# Patient Record
Sex: Female | Born: 1975 | Race: White | Hispanic: No | Marital: Married | Smoking: Never smoker
Health system: Southern US, Community
[De-identification: ages and names within clinical notes are randomized; demographics above are authoritative.]

## PROBLEM LIST (undated history)

## (undated) DIAGNOSIS — E119 Type 2 diabetes mellitus without complications: Secondary | ICD-10-CM

## (undated) DIAGNOSIS — F419 Anxiety disorder, unspecified: Secondary | ICD-10-CM

## (undated) DIAGNOSIS — I10 Essential (primary) hypertension: Secondary | ICD-10-CM

## (undated) DIAGNOSIS — G629 Polyneuropathy, unspecified: Secondary | ICD-10-CM

## (undated) HISTORY — PX: ABDOMINAL HYSTERECTOMY: SHX81

## (undated) HISTORY — PX: TOTAL ABDOMINAL HYSTERECTOMY: SHX209

---

## 2022-02-25 ENCOUNTER — Other Ambulatory Visit: Payer: Self-pay

## 2022-02-25 ENCOUNTER — Emergency Department (HOSPITAL_BASED_OUTPATIENT_CLINIC_OR_DEPARTMENT_OTHER)
Admission: EM | Admit: 2022-02-25 | Discharge: 2022-02-25 | Disposition: A | Discharge status: Home / Self Care | Payer: BLUE CROSS/BLUE SHIELD | Attending: Emergency Medicine | Admitting: Emergency Medicine

## 2022-02-25 ENCOUNTER — Encounter (HOSPITAL_BASED_OUTPATIENT_CLINIC_OR_DEPARTMENT_OTHER): Payer: Self-pay | Admitting: Emergency Medicine

## 2022-02-25 DIAGNOSIS — R197 Diarrhea, unspecified: Secondary | ICD-10-CM | POA: Diagnosis not present

## 2022-02-25 DIAGNOSIS — J029 Acute pharyngitis, unspecified: Secondary | ICD-10-CM | POA: Diagnosis not present

## 2022-02-25 DIAGNOSIS — R059 Cough, unspecified: Secondary | ICD-10-CM | POA: Insufficient documentation

## 2022-02-25 DIAGNOSIS — R110 Nausea: Secondary | ICD-10-CM | POA: Diagnosis not present

## 2022-02-25 DIAGNOSIS — E119 Type 2 diabetes mellitus without complications: Secondary | ICD-10-CM | POA: Diagnosis not present

## 2022-02-25 DIAGNOSIS — H66003 Acute suppurative otitis media without spontaneous rupture of ear drum, bilateral: Secondary | ICD-10-CM | POA: Insufficient documentation

## 2022-02-25 DIAGNOSIS — I10 Essential (primary) hypertension: Secondary | ICD-10-CM | POA: Diagnosis not present

## 2022-02-25 DIAGNOSIS — Z20822 Contact with and (suspected) exposure to covid-19: Secondary | ICD-10-CM | POA: Insufficient documentation

## 2022-02-25 DIAGNOSIS — H9192 Unspecified hearing loss, left ear: Secondary | ICD-10-CM | POA: Diagnosis present

## 2022-02-25 HISTORY — DX: Type 2 diabetes mellitus without complications: E11.9

## 2022-02-25 HISTORY — DX: Anxiety disorder, unspecified: F41.9

## 2022-02-25 HISTORY — DX: Polyneuropathy, unspecified: G62.9

## 2022-02-25 HISTORY — DX: Essential (primary) hypertension: I10

## 2022-02-25 LAB — GROUP A STREP BY PCR: Group A Strep by PCR: NOT DETECTED

## 2022-02-25 LAB — RESP PANEL BY RT-PCR (FLU A&B, COVID) ARPGX2
Influenza A by PCR: NEGATIVE
Influenza B by PCR: NEGATIVE
SARS Coronavirus 2 by RT PCR: NEGATIVE

## 2022-02-25 MED ORDER — AZITHROMYCIN 250 MG PO TABS: ORAL_TABLET | ORAL | 0 refills | Status: DC

## 2022-02-25 MED ORDER — AZITHROMYCIN 250 MG PO TABS
500.0000 mg | ORAL_TABLET | Freq: Once | ORAL | Status: AC
  Administered 2022-02-25: 500 mg via ORAL
  Filled 2022-02-25: qty 2

## 2022-02-25 NOTE — ED Triage Notes (Signed)
Bilateral otalgia , cough , sore throat . Nausea . Diarrhea .  ?

## 2022-02-25 NOTE — ED Provider Notes (Signed)
? ?[facility] ?Provider Note: Lowella Dell, MD, FACEP ? ?CSN: [id number] ?MRN: [id number] ?ARRIVAL: 02/25/22 at 2034 ?ROOM: [facility] ? ? ?CHIEF COMPLAINT  ?Ear Pain ? ? ?HISTORY OF PRESENT ILLNESS  ?02/25/22 10:56 PM ?Nicole Alvarado is a 46 y.o. female with 4 days of bilateral ear pain which she rates as a 10 out of 10.  She has decreased hearing in the left ear.  For the past 2 days she has had a cough and a sore throat.  She has not had a fever.  She has had nausea and diarrhea.  She was prescribed antibiotic eardrops after a telemedicine consult but these have not helped. ? ? ?Past Medical History:  ?Diagnosis Date  ? Anxiety   ? Diabetes mellitus without complication (HCC)   ? Hypertension   ? Neuropathy   ? ? ?History reviewed. No pertinent surgical history. ? ?History reviewed. No pertinent family history. ? ?  ? ?Prior to Admission medications   ?Not on File  ? ? ?Allergies ?Penicillins, Bactrim [sulfamethoxazole-trimethoprim], and Sulfa antibiotics ? ? ?REVIEW OF SYSTEMS  ?Negative except as noted here or in the History of Present Illness. ? ? ?PHYSICAL EXAMINATION  ?Initial Vital Signs ?Blood pressure (!) 159/104, pulse 98, temperature 98.2 ?F (36.8 ?C), temperature source Oral, resp. rate 18, weight (!) 140.2 kg, SpO2 98 %. ? ?Examination ?General: Well-developed, well-nourished female in no acute distress; appearance consistent with age of record ?HENT: normocephalic; atraumatic; right TM erythematous; left TM erythematous and bulging with effusion; pharynx normal ?Eyes: pupils equal, round and reactive to light; extraocular muscles intact ?Neck: supple ?Heart: regular rate and rhythm ?Lungs: clear to auscultation bilaterally ?Abdomen: soft; nondistended; nontender; bowel sounds present ?Extremities: No deformity; full range of motion ?Neurologic: Awake, alert and oriented; motor function intact in all extremities and symmetric; no facial droop ?Skin: Warm and dry ?Psychiatric: Normal mood  and affect ? ? ?RESULTS  ?Summary of this visit's results, reviewed and interpreted by myself: ? ? EKG Interpretation ? ?Date/Time:    ?Ventricular Rate:    ?PR Interval:    ?QRS Duration:   ?QT Interval:    ?QTC Calculation:   ?R Axis:     ?Text Interpretation:   ?  ? ?  ? ?Laboratory Studies: ?Results for orders placed or performed during the hospital encounter of 02/25/22 (from the past 24 hour(s))  ?Resp Panel by RT-PCR (Flu A&B, Covid) Nasopharyngeal Swab     Status: None  ? Collection Time: 02/25/22  8:50 PM  ? Specimen: Nasopharyngeal Swab; Nasopharyngeal(NP) swabs in vial transport medium  ?Result Value Ref Range  ? SARS Coronavirus 2 by RT PCR NEGATIVE NEGATIVE  ? Influenza A by PCR NEGATIVE NEGATIVE  ? Influenza B by PCR NEGATIVE NEGATIVE  ?Group A Strep by PCR     Status: None  ? Collection Time: 02/25/22  8:50 PM  ? Specimen: Nasopharyngeal Swab; Sterile Swab  ?Result Value Ref Range  ? Group A Strep by PCR NOT DETECTED NOT DETECTED  ? ?Imaging Studies: ?No results found. ? ?ED COURSE and MDM  ?Nursing notes, initial and subsequent vitals signs, including pulse oximetry, reviewed and interpreted by myself. ? ?Vitals:  ? 02/25/22 2044 02/25/22 2051  ?BP:  (!) 159/104  ?Pulse:  98  ?Resp:  18  ?Temp:  98.2 ?F (36.8 ?C)  ?TempSrc:  Oral  ?SpO2:  98%  ?Weight: (!) 140.2 kg   ? ?Medications - No data to display ? ?The patient's presentation is  consistent with bilateral otitis media worse on the left.  She is negative for strep, COVID and influenza.  Ideally penicillin or cephalosporin would be used to treat this but she has an anaphylactic reaction to penicillins.  She states she can tolerate Zithromax well and we will treat her with Zithromax. ? ?PROCEDURES  ?Procedures ? ? ?ED DIAGNOSES  ? ?  ICD-10-CM   ?1. Non-recurrent acute suppurative otitis media of both ears without spontaneous rupture of tympanic membranes  H66.003   ?  ? ? ? ?  ?Paula Libra, MD ?02/25/22 2312 ? ?

## 2022-02-25 NOTE — ED Notes (Signed)
Patient discharged to home.  All discharge instructions reviewed.  Patient verbalized understanding via teachback method.  VS WDL.  Respirations even and unlabored.  Ambulatory out of ED.   °

## 2022-03-02 ENCOUNTER — Emergency Department (HOSPITAL_BASED_OUTPATIENT_CLINIC_OR_DEPARTMENT_OTHER): Payer: BLUE CROSS/BLUE SHIELD

## 2022-03-02 ENCOUNTER — Emergency Department (HOSPITAL_BASED_OUTPATIENT_CLINIC_OR_DEPARTMENT_OTHER)
Admission: EM | Admit: 2022-03-02 | Discharge: 2022-03-02 | Disposition: A | Discharge status: Home / Self Care | Payer: BLUE CROSS/BLUE SHIELD | Attending: Emergency Medicine | Admitting: Emergency Medicine

## 2022-03-02 ENCOUNTER — Encounter (HOSPITAL_BASED_OUTPATIENT_CLINIC_OR_DEPARTMENT_OTHER): Payer: Self-pay | Admitting: Urology

## 2022-03-02 ENCOUNTER — Other Ambulatory Visit: Payer: Self-pay

## 2022-03-02 DIAGNOSIS — H66003 Acute suppurative otitis media without spontaneous rupture of ear drum, bilateral: Secondary | ICD-10-CM | POA: Diagnosis not present

## 2022-03-02 DIAGNOSIS — Z8616 Personal history of COVID-19: Secondary | ICD-10-CM | POA: Insufficient documentation

## 2022-03-02 DIAGNOSIS — H9202 Otalgia, left ear: Secondary | ICD-10-CM | POA: Insufficient documentation

## 2022-03-02 DIAGNOSIS — Z20822 Contact with and (suspected) exposure to covid-19: Secondary | ICD-10-CM | POA: Insufficient documentation

## 2022-03-02 LAB — RESP PANEL BY RT-PCR (FLU A&B, COVID) ARPGX2
Influenza A by PCR: NEGATIVE
Influenza B by PCR: NEGATIVE
SARS Coronavirus 2 by RT PCR: NEGATIVE

## 2022-03-02 MED ORDER — DOXYCYCLINE HYCLATE 100 MG PO CAPS: 100.0000 mg | ORAL_CAPSULE | Freq: Two times a day (BID) | ORAL | 0 refills | Status: DC

## 2022-03-02 NOTE — ED Triage Notes (Signed)
Seen last week for bilateral ear infection, not getting any better ?Having productive cough x 2 days ?States exposure to COVID Saturday  ? ?

## 2022-03-02 NOTE — ED Provider Notes (Signed)
[facility] EMERGENCY DEPARTMENT Provider Note   CSN: [id number] Arrival date & time: 03/02/22  1443     History  Chief Complaint  Patient presents with   Otalgia    Nicole Alvarado is a 46 y.o. female.  HPI Patient is a 46 year old female who presents to the emergency department due to ear pain.  States it is bilateral, left greater than right.  States that she has been experiencing her symptoms for the past 10 days.  She was initially evaluated on March 1 and diagnosed with acute suppurative otitis media of both ears and discharged on a course of azithromycin.  Patient notes an allergy to penicillin resulting in anaphylaxis.  She states that she completed the course of antibiotics and her symptoms have continued to worsen.  She states that it is now much worse in the left ear and radiates posterior to the left ear as well.  Denies any drainage in either ear.  States her symptoms tend to worsen at night.  Also notes that she began having a productive cough with green sputum 2 days ago and also had a recent COVID-19 exposure.  Denies any rhinorrhea, chest pain, shortness of breath, nausea, vomiting.    Home Medications Prior to Admission medications   Medication Sig Start Date End Date Taking? Authorizing Provider  doxycycline (VIBRAMYCIN) 100 MG capsule Take 1 capsule (100 mg total) by mouth 2 (two) times daily. 03/02/22  Yes Placido Sou, PA-C  azithromycin (ZITHROMAX) 250 MG tablet Take 1 tablet at bedtime starting Thursday 02/26/2022. 02/25/22   Molpus, John, MD      Allergies    Penicillins, Bactrim [sulfamethoxazole-trimethoprim], and Sulfa antibiotics    Review of Systems   Review of Systems  All other systems reviewed and are negative. Ten systems reviewed and are negative for acute change, except as noted in the HPI.   Physical Exam Updated Vital Signs BP 127/64 (BP Location: Left Arm)    Pulse 98    Temp 97.6 F (36.4 C) (Oral)    Resp 16    Ht 5\' 6"  (1.676  m)    Wt (!) 140.2 kg    SpO2 99%    BMI 49.89 kg/m  Physical Exam Vitals and nursing note reviewed.  Constitutional:      General: She is not in acute distress.    Appearance: Normal appearance. She is not ill-appearing, toxic-appearing or diaphoretic.  HENT:     Head: Normocephalic and atraumatic.     Right Ear: Ear canal and external ear normal. There is no impacted cerumen.     Left Ear: Ear canal and external ear normal. There is no impacted cerumen.     Ears:     Comments: Bilateral ears and EACs appear normal.  Bilateral TMs appear erythematous.  Right TM has good bony landmarks and is nonbulging.  Left TM appears significantly more erythematous and slightly bulging.  No tenderness appreciated with tragal manipulation bilaterally.  Moderate tenderness noted over the left mastoid.    Nose: Nose normal.     Mouth/Throat:     Mouth: Mucous membranes are moist.     Pharynx: Oropharynx is clear. No oropharyngeal exudate or posterior oropharyngeal erythema.     Comments: Uvula midline.  No erythema or exudates noted in the posterior oropharynx.  Readily handling secretions.  No hot potato voice. Eyes:     General: No scleral icterus.       Right eye: No discharge.  Left eye: No discharge.     Extraocular Movements: Extraocular movements intact.     Conjunctiva/sclera: Conjunctivae normal.  Cardiovascular:     Rate and Rhythm: Normal rate and regular rhythm.     Pulses: Normal pulses.     Heart sounds: Normal heart sounds. No murmur heard.   No friction rub. No gallop.  Pulmonary:     Effort: Pulmonary effort is normal. No respiratory distress.     Breath sounds: Normal breath sounds. No stridor. No wheezing, rhonchi or rales.  Abdominal:     General: Abdomen is flat.     Palpations: Abdomen is soft.     Tenderness: There is no abdominal tenderness.  Musculoskeletal:        General: Normal range of motion.     Cervical back: Normal range of motion and neck supple. No  tenderness.  Skin:    General: Skin is warm and dry.  Neurological:     General: No focal deficit present.     Mental Status: She is alert and oriented to person, place, and time.  Psychiatric:        Mood and Affect: Mood normal.        Behavior: Behavior normal.   ED Results / Procedures / Treatments   Labs (all labs ordered are listed, but only abnormal results are displayed) Labs Reviewed  RESP PANEL BY RT-PCR (FLU A&B, COVID) [id number]   EKG None  Radiology CT Temporal Bones Wo Contrast  Result Date: 03/02/2022 CLINICAL DATA:  Tenderness over left mastoid air cells, recent otitis media EXAM: CT TEMPORAL BONES WITHOUT CONTRAST TECHNIQUE: Axial and coronal plane CT imaging of the petrous temporal bones was performed with thin-collimation image reconstruction. No intravenous contrast was administered. Multiplanar CT image reconstructions were also generated. RADIATION DOSE REDUCTION: This exam was performed according to the departmental dose-optimization program which includes automated exposure control, adjustment of the mA and/or kV according to patient size and/or use of iterative reconstruction technique. COMPARISON:  No pertinent prior exam. FINDINGS: RIGHT TEMPORAL BONE External auditory canal: Normal. The tympanic membrane is not thickened. Middle ear cavity: Normally aerated. The scutum and ossicles are normal. The tegmen tympani is intact. Inner ear structures: The cochlea, vestibule and semicircular canals are normal. The vestibular aqueduct is not enlarged. Internal auditory and facial nerve canals:  Normal Mastoid air cells: There is a small amount of fluid in the right mastoid tip. There is no septal destruction. The tegmen mastoideum is intact. LEFT TEMPORAL BONE External auditory canal: External auditory canal is normal. There is mild retraction of the tympanic membrane. Middle ear cavity: There is partial opacification of the middle ear cavity with fluid/soft tissue density  surrounding portions of the ossicles. There is no ossicular or scutal erosion. The tegmen tympani is intact. Inner ear structures: The cochlea, vestibule and semicircular canals are normal. The vestibular aqueduct is not enlarged. Internal auditory and facial nerve canals:  Normal. Mastoid air cells: There is a left mastoid effusion without evidence of septal destruction. The tegmen mastoideum is intact. Vascular: Normal non-contrast appearance of the carotid canals, jugular bulbs and sigmoid plates. Limited intracranial:  No acute or significant finding. Visible orbits/paranasal sinuses: The globes and orbits are unremarkable. The imaged paranasal sinuses are clear. Soft tissues: There is no significant soft tissue swelling over the left mastoid air cells. There is no organized or drainable fluid collection. IMPRESSION: 1. Left mastoid and middle ear effusions with no evidence of ossicular or scutal erosion or septal  destruction. No significant inflammatory change in the overlying soft tissues, and no organized or drainable fluid collection. 2. Smaller right mastoid effusion without evidence of septal destruction. Electronically Signed   By: Lesia Hausen M.D.   On: 03/02/2022 17:27    Procedures Procedures   Medications Ordered in ED Medications - No data to display  ED Course/ Medical Decision Making/ A&P                           Medical Decision Making Amount and/or Complexity of Data Reviewed Radiology: ordered.  Risk Prescription drug management.  Pt is a 46 y.o. female who presents to the emergency department for reevaluation of bilateral otitis media.  Recently diagnosed on March 1 and completed a course of azithromycin.  Worsening symptoms since then.  Now reports tenderness to the left mastoid.  Also notes a productive cough that started 2 days ago.  No chest pain or shortness of breath.  Labs: Respiratory panel is negative.  Imaging: CT scan of the temporal bone shows a left  mastoid and middle ear effusion with no evidence of ossicular or scrotal erosion or septal destruction.  No significant inflammatory change in the overlying soft tissues, and no organized or drainable fluid collection.  Smaller right mastoid effusion without evidence of septal destruction.  I, Placido Sou, PA-C, personally reviewed and evaluated these images and lab results as part of my medical decision-making.  Physical exam concerning for persistent bilateral otitis media that appears to be significantly worse on the left side.  TMs both still appear intact.  Patient also was noted to have some left mastoid tenderness.  No overlying skin changes noted around the mastoid.  No lymphadenopathy.  Discussed CT scan of the temporal bones versus new trial of antibiotics and discharge home and patient preferred moving forward with the CT scan.  This was obtained with findings as noted above.  Does appear to have bilateral mastoid effusions that is worse on the left.  No erosion or septal destruction.  No significant inflammatory changes in the overlying soft tissues and no organized or drainable fluid collection.  We will discharge patient on a course of doxycycline.  Again, she has an allergy to penicillins including anaphylaxis.  States she is status post hysterectomy.  Patient given a referral to ENT and urged her to follow-up with him first thing tomorrow.  Patient understands to return to the emergency department with any new or worsening symptoms.  She verbalized understanding of the above plan.  Feel that she is stable for discharge at this time and she is agreeable.  Her questions were answered and she was amicable at the time of discharge.  Note: Portions of this report may have been transcribed using voice recognition software. Every effort was made to ensure accuracy; however, inadvertent computerized transcription errors may be present.   Final Clinical Impression(s) / ED Diagnoses Final  diagnoses:  Pain of left mastoid  Non-recurrent acute suppurative otitis media of both ears without spontaneous rupture of tympanic membranes   Rx / DC Orders ED Discharge Orders          Ordered    doxycycline (VIBRAMYCIN) 100 MG capsule  2 times daily        03/02/22 1752              Placido Sou, PA-C 03/02/22 1759    Horton, Clabe Seal, DO 03/02/22 2307

## 2022-03-02 NOTE — Discharge Instructions (Addendum)
Like we discussed, I have given you a referral to Dr. Marcelline Deist.  He is a Marine scientist, nose, and throat physician.  Please give them a call soon as possible to schedule an appointment for reevaluation. ? ?I am prescribing you an antibiotic called doxycycline.  I want you to take this twice per day for the next 10 days.  Do not stop taking this early even if you feel your symptoms have improved. ? ?If you develop any new or worsening symptoms please come back to the emergency department immediately for reevaluation. ?

## 2022-03-03 ENCOUNTER — Encounter (HOSPITAL_BASED_OUTPATIENT_CLINIC_OR_DEPARTMENT_OTHER): Payer: Self-pay

## 2022-03-03 ENCOUNTER — Emergency Department (HOSPITAL_BASED_OUTPATIENT_CLINIC_OR_DEPARTMENT_OTHER)
Admission: EM | Admit: 2022-03-03 | Discharge: 2022-03-03 | Disposition: A | Discharge status: Home / Self Care | Payer: BLUE CROSS/BLUE SHIELD | Attending: Emergency Medicine | Admitting: Emergency Medicine

## 2022-03-03 ENCOUNTER — Other Ambulatory Visit: Payer: Self-pay

## 2022-03-03 DIAGNOSIS — H6693 Otitis media, unspecified, bilateral: Secondary | ICD-10-CM | POA: Insufficient documentation

## 2022-03-03 DIAGNOSIS — H9203 Otalgia, bilateral: Secondary | ICD-10-CM | POA: Diagnosis present

## 2022-03-03 DIAGNOSIS — H6690 Otitis media, unspecified, unspecified ear: Secondary | ICD-10-CM

## 2022-03-03 MED ORDER — CIPROFLOXACIN-DEXAMETHASONE 0.3-0.1 % OT SUSP: 4.0000 [drp] | Freq: Once | OTIC | Status: DC

## 2022-03-03 MED ORDER — OXYCODONE HCL 5 MG PO TABS
5.0000 mg | ORAL_TABLET | Freq: Four times a day (QID) | ORAL | 0 refills | Status: AC | PRN
Start: 2022-03-03 — End: 2022-03-13

## 2022-03-03 MED ORDER — OXYCODONE HCL 5 MG PO TABS
5.0000 mg | ORAL_TABLET | Freq: Once | ORAL | Status: AC
  Administered 2022-03-03: 5 mg via ORAL
  Filled 2022-03-03: qty 1

## 2022-03-03 NOTE — ED Triage Notes (Signed)
Pt to er, pt states that pt is here for bilateral ear pain.  Pt states that she has been here twice already for the same thing.  States that she had a ct yesterday and was told she had some water behind her ear.  Pt tearful.   ?

## 2022-03-03 NOTE — ED Provider Notes (Signed)
[facility] EMERGENCY DEPARTMENT Provider Note   CSN: [id number] Arrival date & time: 03/03/22  0703     History  Chief Complaint  Patient presents with   Otalgia    Nicole Alvarado is a 46 y.o. female.  The history is provided by the patient.  Otalgia Location:  Bilateral Behind ear:  No abnormality Quality:  Aching Severity:  Mild Onset quality:  Gradual Duration:  6 days Timing:  Intermittent Progression:  Waxing and waning Chronicity:  New Relieved by:  Nothing Worsened by:  Nothing Associated symptoms: no abdominal pain, no congestion, no cough, no diarrhea, no ear discharge, no fever, no headaches, no hearing loss, no neck pain, no rash, no rhinorrhea, no sore throat, no tinnitus and no vomiting       Home Medications Prior to Admission medications   Medication Sig Start Date End Date Taking? Authorizing Provider  oxyCODONE (ROXICODONE) 5 MG immediate release tablet Take 1 tablet (5 mg total) by mouth every 6 (six) hours as needed for up to 10 days for breakthrough pain. 03/03/22 03/13/22 Yes Lainie Daubert, DO  azithromycin (ZITHROMAX) 250 MG tablet Take 1 tablet at bedtime starting Thursday 02/26/2022. 02/25/22   Molpus, John, MD  doxycycline (VIBRAMYCIN) 100 MG capsule Take 1 capsule (100 mg total) by mouth 2 (two) times daily. 03/02/22   Placido Sou, PA-C      Allergies    Penicillins, Bactrim [sulfamethoxazole-trimethoprim], and Sulfa antibiotics    Review of Systems   Review of Systems  Constitutional:  Negative for fever.  HENT:  Positive for ear pain. Negative for congestion, ear discharge, hearing loss, rhinorrhea, sore throat and tinnitus.   Respiratory:  Negative for cough.   Gastrointestinal:  Negative for abdominal pain, diarrhea and vomiting.  Musculoskeletal:  Negative for neck pain.  Skin:  Negative for rash.  Neurological:  Negative for headaches.   Physical Exam Updated Vital Signs BP (!) 175/84 (BP Location: Right Arm)    Pulse  94    Temp 97.9 F (36.6 C) (Oral)    Resp 18    Ht 5\' 7"  (1.702 m)    Wt (!) 140.2 kg    SpO2 100%    BMI 48.40 kg/m  Physical Exam Vitals and nursing note reviewed.  Constitutional:      General: She is not in acute distress.    Appearance: She is well-developed.  HENT:     Head: Normocephalic and atraumatic.     Comments: Bilateral TMs are erythematous with some mild effusion.  Tympanic membranes appear to be intact, there is no swelling to the mastoid area    Right Ear: Ear canal normal. There is no impacted cerumen.     Left Ear: Ear canal normal. There is no impacted cerumen.     Nose: Nose normal.     Mouth/Throat:     Mouth: Mucous membranes are moist.  Eyes:     Extraocular Movements: Extraocular movements intact.     Conjunctiva/sclera: Conjunctivae normal.     Pupils: Pupils are equal, round, and reactive to light.  Cardiovascular:     Rate and Rhythm: Normal rate and regular rhythm.     Heart sounds: No murmur heard. Pulmonary:     Effort: Pulmonary effort is normal. No respiratory distress.     Breath sounds: Normal breath sounds.  Abdominal:     Palpations: Abdomen is soft.     Tenderness: There is no abdominal tenderness.  Musculoskeletal:  General: No swelling.     Cervical back: Neck supple.  Skin:    General: Skin is warm and dry.     Capillary Refill: Capillary refill takes less than 2 seconds.  Neurological:     Mental Status: She is alert.  Psychiatric:        Mood and Affect: Mood normal.    ED Results / Procedures / Treatments   Labs (all labs ordered are listed, but only abnormal results are displayed) Labs Reviewed - No data to display  EKG None  Radiology CT Temporal Bones Wo Contrast  Result Date: 03/02/2022 CLINICAL DATA:  Tenderness over left mastoid air cells, recent otitis media EXAM: CT TEMPORAL BONES WITHOUT CONTRAST TECHNIQUE: Axial and coronal plane CT imaging of the petrous temporal bones was performed with thin-collimation  image reconstruction. No intravenous contrast was administered. Multiplanar CT image reconstructions were also generated. RADIATION DOSE REDUCTION: This exam was performed according to the departmental dose-optimization program which includes automated exposure control, adjustment of the mA and/or kV according to patient size and/or use of iterative reconstruction technique. COMPARISON:  No pertinent prior exam. FINDINGS: RIGHT TEMPORAL BONE External auditory canal: Normal. The tympanic membrane is not thickened. Middle ear cavity: Normally aerated. The scutum and ossicles are normal. The tegmen tympani is intact. Inner ear structures: The cochlea, vestibule and semicircular canals are normal. The vestibular aqueduct is not enlarged. Internal auditory and facial nerve canals:  Normal Mastoid air cells: There is a small amount of fluid in the right mastoid tip. There is no septal destruction. The tegmen mastoideum is intact. LEFT TEMPORAL BONE External auditory canal: External auditory canal is normal. There is mild retraction of the tympanic membrane. Middle ear cavity: There is partial opacification of the middle ear cavity with fluid/soft tissue density surrounding portions of the ossicles. There is no ossicular or scutal erosion. The tegmen tympani is intact. Inner ear structures: The cochlea, vestibule and semicircular canals are normal. The vestibular aqueduct is not enlarged. Internal auditory and facial nerve canals:  Normal. Mastoid air cells: There is a left mastoid effusion without evidence of septal destruction. The tegmen mastoideum is intact. Vascular: Normal non-contrast appearance of the carotid canals, jugular bulbs and sigmoid plates. Limited intracranial:  No acute or significant finding. Visible orbits/paranasal sinuses: The globes and orbits are unremarkable. The imaged paranasal sinuses are clear. Soft tissues: There is no significant soft tissue swelling over the left mastoid air cells. There  is no organized or drainable fluid collection. IMPRESSION: 1. Left mastoid and middle ear effusions with no evidence of ossicular or scutal erosion or septal destruction. No significant inflammatory change in the overlying soft tissues, and no organized or drainable fluid collection. 2. Smaller right mastoid effusion without evidence of septal destruction. Electronically Signed   By: Lesia Hausen M.D.   On: 03/02/2022 17:27    Procedures Procedures    Medications Ordered in ED Medications  oxyCODONE (Oxy IR/ROXICODONE) immediate release tablet 5 mg (has no administration in time range)    ED Course/ Medical Decision Making/ A&P                           Medical Decision Making Risk Prescription drug management.   Nicole Alvarado is here for reevaluation of ear pain.  History of diabetes, hypertension.  Was seen here yesterday for the same.  Upon chart review she had CT scan of her temporal bones that was overall unremarkable.  No evidence of mastoiditis.  She appears to have bilateral ear infection on exam.  Tympanic membranes appear to be intact.  She was prescribed doxycycline yesterday.  She states that she is also on eardrops that were prescribed by a telemedicine provider.  She still having discomfort.  She does have allergies to penicillin.  Overall I have no concern for other acute process especially given work-up from yesterday.  I do not have concern for mastoiditis or other deep space infection.  She appears to have bilateral otitis media.  Pain has been difficult to control with over-the-counter medications.  I will prescribe her a short course of Roxicodone.  We will have her follow-up with ENT.  Discharged in good condition.  This chart was dictated using voice recognition software.  Despite best efforts to proofread,  errors can occur which can change the documentation meaning.         Final Clinical Impression(s) / ED Diagnoses Final diagnoses:  Acute otitis media,  unspecified otitis media type    Rx / DC Orders ED Discharge Orders          Ordered    oxyCODONE (ROXICODONE) 5 MG immediate release tablet  Every 6 hours PRN        03/03/22 0726              Virgina Norfolk, DO 03/03/22 6144

## 2022-03-03 NOTE — ED Notes (Signed)
Pt in bed, pt reports a slight decrease in pain, pt is no longer tearful, sig other at bedside, pt states that she is ready to go home, verbalized understanding d/c instructions and follow up, ambulatory from dpt.  ?

## 2022-03-03 NOTE — Discharge Instructions (Addendum)
Continue taking antibiotics both orally and drops.  Continue 1000 mg of Tylenol every 6 hours as needed for pain.  Continue 800 mg ibuprofen every 8 hours as needed for pain.  I have prescribed you narcotic pain medicine.  Please take this as prescribed.  Do not mix with alcohol, drugs, heavy machinery use, driving or other dangerous activities as this medication is sedating.  Please follow-up with ENT. ?

## 2022-03-29 ENCOUNTER — Emergency Department (HOSPITAL_COMMUNITY)
Admission: EM | Admit: 2022-03-29 | Discharge: 2022-03-29 | Disposition: A | Discharge status: Home / Self Care | Payer: BLUE CROSS/BLUE SHIELD | Attending: Emergency Medicine | Admitting: Emergency Medicine

## 2022-03-29 ENCOUNTER — Encounter (HOSPITAL_COMMUNITY): Payer: Self-pay | Admitting: Emergency Medicine

## 2022-03-29 ENCOUNTER — Other Ambulatory Visit: Payer: Self-pay

## 2022-03-29 DIAGNOSIS — H6693 Otitis media, unspecified, bilateral: Secondary | ICD-10-CM | POA: Diagnosis not present

## 2022-03-29 DIAGNOSIS — H6593 Unspecified nonsuppurative otitis media, bilateral: Secondary | ICD-10-CM

## 2022-03-29 DIAGNOSIS — H9203 Otalgia, bilateral: Secondary | ICD-10-CM | POA: Diagnosis present

## 2022-03-29 MED ORDER — LORATADINE 10 MG PO TABS
10.0000 mg | ORAL_TABLET | Freq: Once | ORAL | Status: AC
  Administered 2022-03-29: 10 mg via ORAL
  Filled 2022-03-29: qty 1

## 2022-03-29 MED ORDER — PSEUDOEPHEDRINE HCL ER 120 MG PO TB12
120.0000 mg | ORAL_TABLET | Freq: Once | ORAL | Status: AC
  Administered 2022-03-29: 120 mg via ORAL
  Filled 2022-03-29: qty 1

## 2022-03-29 MED ORDER — CEFDINIR 300 MG PO CAPS: 300.0000 mg | ORAL_CAPSULE | Freq: Two times a day (BID) | ORAL | 0 refills | Status: DC

## 2022-03-29 MED ORDER — CEFDINIR 300 MG PO CAPS
300.0000 mg | ORAL_CAPSULE | Freq: Two times a day (BID) | ORAL | Status: DC
  Administered 2022-03-29: 300 mg via ORAL
  Filled 2022-03-29: qty 1

## 2022-03-29 MED ORDER — CETIRIZINE-PSEUDOEPHEDRINE ER 5-120 MG PO TB12: 1.0000 | ORAL_TABLET | Freq: Two times a day (BID) | ORAL | 0 refills | Status: DC | PRN

## 2022-03-29 NOTE — Discharge Instructions (Addendum)
It was our pleasure to provide your ER care today - we hope that you feel better. ? ?Drink plenty of fluids/stay well hydrated.  ? ?Take antibiotic as prescribed.  ? ?Take zyrtec-d (antihistamine/decongestant).  ? ?Follow up closely with ENT doctor this coming week - call office tomorrow to arrange appointment. ? ?Return to ER if worse, new symptoms, high fevers, intractable/severe pain, severe headache, or other concern.  ?

## 2022-03-29 NOTE — ED Triage Notes (Signed)
Pt c/o bilateral ear pain x 1-2 months; reports she has been Rx'ed ear drops and abx x 2 with no improvment ?

## 2022-03-29 NOTE — ED Provider Notes (Signed)
?[facility] EMERGENCY DEPARTMENT ?Provider Note ? ? ?CSN: [id number] ?Arrival date & time: 03/29/22  2119 ? ?  ? ?History ? ?Chief Complaint  ?Patient presents with  ? Otalgia  ? ? ?Nicole Alvarado is a 46 y.o. female. ? ?Patient c/o bilateral ear pain for past 1-2 months. Symptoms acute onset, moderate, persistent. States transiently relief on abx therapy, but not complete. Prior to this period, no hx recurrent ear infections. Denies hx chronic environmental allergies, although recently moved here from [place].  Denies ear trauma. No hearing loss or tinnitus. No drainage from ears. No facial or ear swelling. No sinus congestion or drainage. Did have sore throat, congestion at onset of symptoms, but those other symptoms have resolved. No severe headaches. No neck pain or stiffness.  No current abx therapy. No regular antihistamine/decongestant use.  ? ?The history is provided by the patient and medical records.  ?Otalgia ?Associated symptoms: no cough, no fever, no headaches, no neck pain, no rash, no sore throat and no vomiting   ? ?  ? ?Home Medications ?Prior to Admission medications   ?Medication Sig Start Date End Date Taking? Authorizing Provider  ?azithromycin (ZITHROMAX) 250 MG tablet Take 1 tablet at bedtime starting Thursday 02/26/2022. 02/25/22   Molpus, John, MD  ?doxycycline (VIBRAMYCIN) 100 MG capsule Take 1 capsule (100 mg total) by mouth 2 (two) times daily. 03/02/22   Placido Sou, PA-C  ?   ? ?Allergies    ?Penicillins, Bactrim [sulfamethoxazole-trimethoprim], and Sulfa antibiotics   ? ?Review of Systems   ?Review of Systems  ?Constitutional:  Negative for chills and fever.  ?HENT:  Positive for ear pain. Negative for sore throat.   ?Respiratory:  Negative for cough and shortness of breath.   ?Gastrointestinal:  Negative for nausea and vomiting.  ?Musculoskeletal:  Negative for neck pain and neck stiffness.  ?Skin:  Negative for rash.  ?Neurological:  Negative for headaches.  ? ?Physical Exam ?Updated  Vital Signs ?BP 131/82 (BP Location: Right Arm)   Pulse 97   Temp 97.9 ?F (36.6 ?C) (Oral)   Resp 17   Ht 1.702 m (5\' 7" )   Wt (!) 145.2 kg   SpO2 100%   BMI 50.12 kg/m?  ?Physical Exam ?Vitals and nursing note reviewed.  ?Constitutional:   ?   Appearance: Normal appearance. She is well-developed.  ?HENT:  ?   Head: Atraumatic.  ?   Comments: No sinus or temporal tenderness.  ?   Ears:  ?   Comments: Eac'c clear, no swelling or tenderness. No mastoid tenderness. Fluid behind bil tms, w mild erythema to tm.  ?   Nose: Nose normal.  ?   Mouth/Throat:  ?   Mouth: Mucous membranes are moist.  ?Eyes:  ?   General: No scleral icterus. ?   Conjunctiva/sclera: Conjunctivae normal.  ?   Pupils: Pupils are equal, round, and reactive to light.  ?Neck:  ?   Trachea: No tracheal deviation.  ?   Comments: No stiffness or rigidity.  ?Cardiovascular:  ?   Rate and Rhythm: Normal rate.  ?   Pulses: Normal pulses.  ?Pulmonary:  ?   Effort: Pulmonary effort is normal. No respiratory distress.  ?Genitourinary: ?   Comments: No cva tenderness.  ?Musculoskeletal:     ?   General: No swelling.  ?   Cervical back: Normal range of motion and neck supple. No rigidity or tenderness. No muscular tenderness.  ?Lymphadenopathy:  ?   Cervical: No cervical adenopathy.  ?  Skin: ?   General: Skin is warm and dry.  ?   Findings: No rash.  ?Neurological:  ?   Mental Status: She is alert.  ?   Comments: Alert, speech normal. Steady gait.   ?Psychiatric:     ?   Mood and Affect: Mood normal.  ? ? ?ED Results / Procedures / Treatments   ?Labs ?(all labs ordered are listed, but only abnormal results are displayed) ?Labs Reviewed - No data to display ? ?EKG ?None ? ?Radiology ?No results found. ? ?Procedures ?Procedures  ? ? ?Medications Ordered in ED ?Medications - No data to display ? ?ED Course/ Medical Decision Making/ A&P ?  ?                        ?Medical Decision Making ?Problems Addressed: ?Bilateral otitis media with effusion: acute  illness or injury ?   Details: recurrent ? ?Amount and/or Complexity of Data Reviewed ?External Data Reviewed: notes. ? ?Risk ?OTC drugs. ?Prescription drug management. ? ? ?Overall, pt is well, not-toxic appearing.  ? ?Reviewed nursing notes and prior charts for additional history.  ? ?Will give abx rx. Also rec zyrtec d and ENT f/u. ? ?Return precautions provided.  ? ? ? ? ? ? ? ? ? ?Final Clinical Impression(s) / ED Diagnoses ?Final diagnoses:  ?None  ? ? ?Rx / DC Orders ?ED Discharge Orders   ? ? None  ? ?  ? ? ?  ?Cathren Laine, MD ?03/29/22 2232 ? ?

## 2022-03-29 NOTE — ED Notes (Signed)
ED Provider at bedside. 

## 2022-07-09 ENCOUNTER — Emergency Department (HOSPITAL_COMMUNITY)
Admission: EM | Admit: 2022-07-09 | Discharge: 2022-07-09 | Disposition: A | Discharge status: Home / Self Care | Payer: BLUE CROSS/BLUE SHIELD | Attending: Emergency Medicine | Admitting: Emergency Medicine

## 2022-07-09 ENCOUNTER — Emergency Department (HOSPITAL_COMMUNITY): Payer: BLUE CROSS/BLUE SHIELD

## 2022-07-09 ENCOUNTER — Other Ambulatory Visit: Payer: Self-pay

## 2022-07-09 ENCOUNTER — Encounter (HOSPITAL_COMMUNITY): Payer: Self-pay | Admitting: Emergency Medicine

## 2022-07-09 DIAGNOSIS — R110 Nausea: Secondary | ICD-10-CM | POA: Insufficient documentation

## 2022-07-09 DIAGNOSIS — R0602 Shortness of breath: Secondary | ICD-10-CM | POA: Insufficient documentation

## 2022-07-09 DIAGNOSIS — I10 Essential (primary) hypertension: Secondary | ICD-10-CM | POA: Insufficient documentation

## 2022-07-09 DIAGNOSIS — E119 Type 2 diabetes mellitus without complications: Secondary | ICD-10-CM | POA: Insufficient documentation

## 2022-07-09 DIAGNOSIS — R072 Precordial pain: Secondary | ICD-10-CM | POA: Insufficient documentation

## 2022-07-09 DIAGNOSIS — M79605 Pain in left leg: Secondary | ICD-10-CM | POA: Insufficient documentation

## 2022-07-09 DIAGNOSIS — R079 Chest pain, unspecified: Secondary | ICD-10-CM

## 2022-07-09 LAB — BASIC METABOLIC PANEL
Anion gap: 6 (ref 5–15)
BUN: 15 mg/dL (ref 6–20)
CO2: 27 mmol/L (ref 22–32)
Calcium: 9 mg/dL (ref 8.9–10.3)
Chloride: 104 mmol/L (ref 98–111)
Creatinine, Ser: 0.96 mg/dL (ref 0.44–1.00)
GFR, Estimated: >60 mL/min (ref >60)
Glucose, Bld: 209 mg/dL — ABNORMAL HIGH (ref 70–99)
Potassium: 3.5 mmol/L (ref 3.5–5.1)
Sodium: 137 mmol/L (ref 135–145)

## 2022-07-09 LAB — CBC
HCT: 44.7 % (ref 36.0–46.0)
Hemoglobin: 15.2 g/dL — ABNORMAL HIGH (ref 12.0–15.0)
MCH: 28.9 pg (ref 26.0–34.0)
MCHC: 34 g/dL (ref 30.0–36.0)
MCV: 85 fL (ref 80.0–100.0)
Platelets: 317 10*3/uL (ref 150–400)
RBC: 5.26 MIL/uL — ABNORMAL HIGH (ref 3.87–5.11)
RDW: 11.9 % (ref 11.5–15.5)
WBC: 9.7 10*3/uL (ref 4.0–10.5)
nRBC: 0 % (ref 0.0–0.2)

## 2022-07-09 LAB — CBG MONITORING, ED: Glucose-Capillary: 226 mg/dL — ABNORMAL HIGH (ref 70–99)

## 2022-07-09 LAB — TROPONIN I (HIGH SENSITIVITY)
Troponin I (High Sensitivity): 5 ng/L (ref <18)
Troponin I (High Sensitivity): 3 ng/L (ref <18)

## 2022-07-09 LAB — D-DIMER, QUANTITATIVE: D-Dimer, Quant: 0.32 ug/mL-FEU (ref 0.00–0.50)

## 2022-07-09 MED ORDER — NITROGLYCERIN 0.4 MG SL SUBL
0.4000 mg | SUBLINGUAL_TABLET | SUBLINGUAL | Status: DC | PRN
  Administered 2022-07-09: 0.4 mg via SUBLINGUAL
  Filled 2022-07-09: qty 1

## 2022-07-09 MED ORDER — MORPHINE SULFATE (PF) 4 MG/ML IV SOLN
6.0000 mg | Freq: Once | INTRAVENOUS | Status: AC
  Administered 2022-07-09: 6 mg via INTRAVENOUS
  Filled 2022-07-09: qty 2

## 2022-07-09 MED ORDER — ONDANSETRON HCL 4 MG/2ML IJ SOLN
4.0000 mg | Freq: Once | INTRAMUSCULAR | Status: AC
  Administered 2022-07-09: 4 mg via INTRAVENOUS
  Filled 2022-07-09: qty 2

## 2022-07-09 MED ORDER — ASPIRIN 81 MG PO CHEW: 324.0000 mg | CHEWABLE_TABLET | Freq: Once | ORAL | Status: DC

## 2022-07-09 NOTE — ED Notes (Signed)
Pt states pain started with pains in left leg then chest started hurting around 4/5 hours ago

## 2022-07-09 NOTE — ED Notes (Signed)
ED Provider at bedside. 

## 2022-07-09 NOTE — ED Provider Notes (Signed)
[facility] EMERGENCY DEPARTMENT Provider Note   CSN: [id number] Arrival date & time: 07/09/22  0441     History  Chief Complaint  Patient presents with   Chest Pain    Nicole Alvarado is a 46 y.o. female.  46 yo F w/ h/o HTN, DM, obesity and family h/o CAD here w/ left chest pain that started as a sharp pain in her left leg. Had some sob with it. Some nausea. No diaphoresis, emesis or light headedness.    Chest Pain Pain location:  Substernal area      Home Medications Prior to Admission medications   Medication Sig Start Date End Date Taking? Authorizing Provider  azithromycin (ZITHROMAX) 250 MG tablet Take 1 tablet at bedtime starting Thursday 02/26/2022. 02/25/22   Molpus, John, MD  cefdinir (OMNICEF) 300 MG capsule Take 1 capsule (300 mg total) by mouth 2 (two) times daily. 03/29/22   Cathren Laine, MD  cetirizine-pseudoephedrine (ZYRTEC-D) 5-120 MG tablet Take 1 tablet by mouth 2 (two) times daily as needed for allergies. 03/29/22   Cathren Laine, MD  doxycycline (VIBRAMYCIN) 100 MG capsule Take 1 capsule (100 mg total) by mouth 2 (two) times daily. 03/02/22   Placido Sou, PA-C      Allergies    Penicillins, Bactrim [sulfamethoxazole-trimethoprim], and Sulfa antibiotics    Review of Systems   Review of Systems  Cardiovascular:  Positive for chest pain.    Physical Exam Updated Vital Signs BP 131/63   Pulse (!) 59   Temp 97.7 F (36.5 C) (Oral)   Resp 20   Ht 5\' 7"  (1.702 m)   Wt (!) 140.6 kg   SpO2 99%   BMI 48.55 kg/m  Physical Exam Vitals and nursing note reviewed.  Constitutional:      Appearance: She is well-developed.  HENT:     Head: Normocephalic and atraumatic.  Cardiovascular:     Rate and Rhythm: Normal rate and regular rhythm.  Pulmonary:     Effort: Pulmonary effort is normal. No tachypnea or respiratory distress.     Breath sounds: No stridor.  Chest:     Chest wall: No mass or tenderness.  Abdominal:     General: There is no  distension.     Palpations: Abdomen is soft.  Musculoskeletal:        General: Normal range of motion.     Cervical back: Normal range of motion.     Right lower leg: No tenderness. No edema.     Left lower leg: No tenderness. No edema.  Skin:    General: Skin is warm and dry.  Neurological:     Mental Status: She is alert.     ED Results / Procedures / Treatments   Labs (all labs ordered are listed, but only abnormal results are displayed) Labs Reviewed  BASIC METABOLIC PANEL - Abnormal; Notable for the following components:      Result Value   Glucose, Bld 209 (*)    All other components within normal limits  CBC - Abnormal; Notable for the following components:   RBC 5.26 (*)    Hemoglobin 15.2 (*)    All other components within normal limits  CBG MONITORING, ED - Abnormal; Notable for the following components:   Glucose-Capillary 226 (*)    All other components within normal limits  D-DIMER, QUANTITATIVE  POC URINE PREG, ED  TROPONIN I (HIGH SENSITIVITY)  TROPONIN I (HIGH SENSITIVITY)    EKG EKG Interpretation  Date/Time:  Thursday  July 09 2022 04:48:46 EDT Ventricular Rate:  73 PR Interval:  170 QRS Duration: 94 QT Interval:  391 QTC Calculation: 431 R Axis:   87 Text Interpretation: Sinus rhythm Low voltage, extremity leads Confirmed by Marily Memos [phone]) on 07/09/2022 6:09:07 AM  Radiology DG Chest 2 View  Result Date: 07/09/2022 CLINICAL DATA:  46 year old female with chest pain, lower extremity pain. EXAM: CHEST - 2 VIEW COMPARISON:  None Available. FINDINGS: PA and lateral views. Normal lung volumes with mild eventration of the right hemidiaphragm, normal variant. Normal cardiac size and mediastinal contours. Visualized tracheal air column is within normal limits. Both lungs appear clear. No pneumothorax or pleural effusion. No acute osseous abnormality identified. Negative visible bowel gas. IMPRESSION: Negative.  No cardiopulmonary abnormality.  Electronically Signed   By: Odessa Fleming M.D.   On: 07/09/2022 05:56    Procedures Procedures    Medications Ordered in ED Medications  nitroGLYCERIN (NITROSTAT) SL tablet 0.4 mg (0.4 mg Sublingual Given 07/09/22 0614)  morphine (PF) 4 MG/ML injection 6 mg (has no administration in time range)  ondansetron (ZOFRAN) injection 4 mg (4 mg Intravenous Given 07/09/22 0536)    ED Course/ Medical Decision Making/ A&P                           Medical Decision Making Amount and/or Complexity of Data Reviewed Labs: ordered. Radiology: ordered.  Risk Prescription drug management.   Low risk and D dimer reassuring, doubt PE.   Ecg and troponin WNL, HEART score of 3, will need second troponin to rule out.   No e/o pneumonia, PTX, pleuritis or other acute causes for symptoms. May be MSK if second troponin is reassuring. Will treat with morphine for now.   Pain resolved. Pending second troponin.   Final Clinical Impression(s) / ED Diagnoses Final diagnoses:  None    Rx / DC Orders ED Discharge Orders     None         Reed Dady, Barbara Cower, MD 07/14/22 0422

## 2022-07-09 NOTE — ED Triage Notes (Signed)
C/o chest pain that started 4 hrs ago that radiates down left arm. Pt describes pain as pressure. 324 mg of aspirin roughly 10 min ago. Pt has no cardiac hx.  Per EMS 113/61 HR 64 99% CBG 216 20G LAC

## 2022-07-09 NOTE — ED Provider Notes (Signed)
Signout from Dr. Clayborne Dana.  46 year old female with no history of cardiac disease here with chest pain.  Plan is to follow-up on second troponin.  If negative can follow-up outpatient with cardiology. Physical Exam  BP (!) 144/87   Pulse 70   Temp 97.7 F (36.5 C) (Oral)   Resp 20   Ht 5\' 7"  (1.702 m)   Wt (!) 140.6 kg   SpO2 95%   BMI 48.55 kg/m   Physical Exam  Procedures  Procedures  ED Course / MDM    Medical Decision Making Amount and/or Complexity of Data Reviewed Labs: ordered. Radiology: ordered.  Risk Prescription drug management.   Delta troponin unchanged.  Patient states she is pain-free currently.  She said she has been under a lot of stressors.  Will give referral to cardiology.  Return instructions discussed       , MD 07/09/22 07/11/22

## 2022-07-09 NOTE — ED Notes (Signed)
Patient transported to X-ray 

## 2022-07-09 NOTE — Discharge Instructions (Signed)
You were seen in the emergency department for chest pain.  You had blood work EKG and chest x-ray that did not show an obvious explanation for your symptoms.  We have put in a referral for you to follow-up with cardiology.  Return to the emergency department if any worsening or concerning symptoms.

## 2022-07-30 ENCOUNTER — Other Ambulatory Visit: Payer: Self-pay

## 2022-07-30 ENCOUNTER — Emergency Department (HOSPITAL_COMMUNITY)
Admission: EM | Admit: 2022-07-30 | Discharge: 2022-07-30 | Disposition: A | Discharge status: Home / Self Care | Payer: Self-pay | Attending: Student | Admitting: Student

## 2022-07-30 DIAGNOSIS — R3915 Urgency of urination: Secondary | ICD-10-CM | POA: Insufficient documentation

## 2022-07-30 DIAGNOSIS — E119 Type 2 diabetes mellitus without complications: Secondary | ICD-10-CM | POA: Insufficient documentation

## 2022-07-30 DIAGNOSIS — R300 Dysuria: Secondary | ICD-10-CM | POA: Insufficient documentation

## 2022-07-30 DIAGNOSIS — R350 Frequency of micturition: Secondary | ICD-10-CM | POA: Insufficient documentation

## 2022-07-30 LAB — URINALYSIS, ROUTINE W REFLEX MICROSCOPIC
Bilirubin Urine: NEGATIVE
Glucose, UA: >=500 mg/dL — AB
Hgb urine dipstick: NEGATIVE
Ketones, ur: NEGATIVE mg/dL
Leukocytes,Ua: NEGATIVE
Nitrite: NEGATIVE
Protein, ur: NEGATIVE mg/dL
Specific Gravity, Urine: 1.021 (ref 1.005–1.030)
pH: 6 (ref 5.0–8.0)

## 2022-07-30 LAB — WET PREP, GENITAL
Clue Cells Wet Prep HPF POC: NONE SEEN
Sperm: NONE SEEN
Trich, Wet Prep: NONE SEEN
WBC, Wet Prep HPF POC: <10 (ref <10)
Yeast Wet Prep HPF POC: NONE SEEN

## 2022-07-30 MED ORDER — NITROFURANTOIN MONOHYD MACRO 100 MG PO CAPS: 100.0000 mg | ORAL_CAPSULE | Freq: Two times a day (BID) | ORAL | 0 refills | Status: DC

## 2022-07-30 NOTE — Discharge Instructions (Signed)
No evidence of urinary tract infection or yeast infection today.  I am going to write you a prescription for Macrobid.  If your dysuria continues for the next 2-3 days you can begin taking this medication.  Please return to the emergency department for any worsening symptoms you might have.  Also provided you a number for primary care doctor to establish care.

## 2022-07-30 NOTE — ED Triage Notes (Signed)
Burning on urination and small bumps in vaginal area. No new partners.

## 2022-07-30 NOTE — ED Provider Notes (Signed)
[facility] EMERGENCY DEPARTMENT Provider Note   CSN: [id number] Arrival date & time: 07/30/22  1334     History No chief complaint on file.   Nicole Alvarado is a 46 y.o. female patient who presents to the emergency department with a 2-week history of itching and dysuria.  Itching is primarily when she urinates.  Patient did a telehealth visit and was prescribed Diflucan which she took with little relief.  She also reports associated urinary frequency and urgency.  Patient does have a history of diabetes and states that her sugars have been a little higher than normal as her machine is broken.  She just moved here and does not have a primary care doctor.  She denies any fever, chills, nausea, vomiting, or back pain.  HPI     Home Medications Prior to Admission medications   Medication Sig Start Date End Date Taking? Authorizing Provider  nitrofurantoin, macrocrystal-monohydrate, (MACROBID) 100 MG capsule Take 1 capsule (100 mg total) by mouth 2 (two) times daily. 07/30/22  Yes Honor Loh M, PA-C  azithromycin (ZITHROMAX) 250 MG tablet Take 1 tablet at bedtime starting Thursday 02/26/2022. 02/25/22   Molpus, John, MD  cefdinir (OMNICEF) 300 MG capsule Take 1 capsule (300 mg total) by mouth 2 (two) times daily. 03/29/22   Cathren Laine, MD  cetirizine-pseudoephedrine (ZYRTEC-D) 5-120 MG tablet Take 1 tablet by mouth 2 (two) times daily as needed for allergies. 03/29/22   Cathren Laine, MD  doxycycline (VIBRAMYCIN) 100 MG capsule Take 1 capsule (100 mg total) by mouth 2 (two) times daily. 03/02/22   Placido Sou, PA-C      Allergies    Penicillins, Bactrim [sulfamethoxazole-trimethoprim], Sulfa antibiotics, and Keflex [cephalexin]    Review of Systems   Review of Systems  All other systems reviewed and are negative.   Physical Exam Updated Vital Signs BP 134/73   Pulse 72   Temp 97.9 F (36.6 C) (Oral)   Resp 19   Ht 5\' 7"  (1.702 m)   Wt (!) 140.6 kg   SpO2 97%   BMI 48.55  kg/m  Physical Exam Vitals and nursing note reviewed. Exam conducted with a chaperone present.  Constitutional:      General: She is not in acute distress.    Appearance: Normal appearance.  HENT:     Head: Normocephalic and atraumatic.  Eyes:     General:        Right eye: No discharge.        Left eye: No discharge.  Cardiovascular:     Comments: Regular rate and rhythm.  S1/S2 are distinct without any evidence of murmur, rubs, or gallops.  Radial pulses are 2+ bilaterally.  Dorsalis pedis pulses are 2+ bilaterally.  No evidence of pedal edema. Pulmonary:     Comments: Clear to auscultation bilaterally.  Normal effort.  No respiratory distress.  No evidence of wheezes, rales, or rhonchi heard throughout. Abdominal:     General: Abdomen is flat. Bowel sounds are normal. There is no distension.     Tenderness: There is no abdominal tenderness. There is no guarding or rebound.  Genitourinary:    Comments: External anatomy appears normal.  There was some slight bumps on the right labial fold likely from razor.  Internal pelvic exam difficult to visualize cervical os secondary to body habitus.  There was slight white discharge in the vaginal canal.  No tenderness or lesions. Musculoskeletal:        General: Normal range of motion.  Cervical back: Neck supple.  Skin:    General: Skin is warm and dry.     Findings: No rash.  Neurological:     General: No focal deficit present.     Mental Status: She is alert.  Psychiatric:        Mood and Affect: Mood normal.        Behavior: Behavior normal.     ED Results / Procedures / Treatments   Labs (all labs ordered are listed, but only abnormal results are displayed) Labs Reviewed  URINALYSIS, ROUTINE W REFLEX MICROSCOPIC - Abnormal; Notable for the following components:      Result Value   Glucose, UA >=500 (*)    Bacteria, UA RARE (*)    All other components within normal limits  WET PREP, GENITAL  URINE CULTURE     EKG None  Radiology No results found.  Procedures Procedures    Medications Ordered in ED Medications - No data to display  ED Course/ Medical Decision Making/ A&P Clinical Course as of 07/30/22 1824  Thu Jul 30, 2022  1758 Urinalysis, Routine w reflex microscopic Urine, Clean Catch(!) Normal. [CF]  1758 Wet prep, genital Normal. [CF]    Clinical Course User Index [CF] Teressa Lower, PA-C                           Medical Decision Making Nicole Alvarado is a 46 y.o. female patient who presents to the emergency department today for further evaluation of dysuria and discharge.  We will obtain a urinalysis and if negative we will plan to do a pelvic exam to evaluate for yeast or bacterial vaginosis.  On reevaluation, urine was negative for any infection.  Pelvic exam did not reveal any significant signs of discharge.  Wet prep was also normal.  I will give her a prescription for Macrobid in the event that this might be a poor specimen or developing infection.  I will have her take it if her symptoms continue in the next 2 to 3 days.  Patient is in a committed relationship with her husband and does not feel at risk for STDs.  Overall, patient safer discharge at this time.  I will provide her [place] primary care for follow-up.  She was encouraged to return to the [facility] apartment for any worsening symptoms.   Amount and/or Complexity of Data Reviewed Labs: ordered. Decision-making details documented in ED Course.  Risk Prescription drug management.   Final Clinical Impression(s) / ED Diagnoses Final diagnoses:  Dysuria    Rx / DC Orders ED Discharge Orders          Ordered    nitrofurantoin, macrocrystal-monohydrate, (MACROBID) 100 MG capsule  2 times daily        07/30/22 1815              Jolyn Lent 07/30/22 Cephus Slater, MD 07/30/22 2145

## 2022-08-01 LAB — URINE CULTURE

## 2022-08-03 NOTE — Progress Notes (Deleted)
Cardiology Office Note:    Date:  08/03/2022   ID:  Nicole Alvarado, DOB 1976/04/08, MRN [id number]  PCP:  Pcp, No    HeartCare Providers Cardiologist:  None { Click to update primary MD,subspecialty MD or APP then REFRESH:1}    Referring MD: Nicole Files, MD   No chief complaint on file. Non cardiac chest pain  History of Present Illness:    Nicole Alvarado is a 46 y.o. female with a hx of anxiety, HTN, no PCP referral from the ED for chest pain. She's had multiple ED visits this year.  She was pain free and associated symptoms with stress. Ekg showed normal rhythm. Troponin was negative x2.  Past Medical History:  Diagnosis Date   Anxiety    Diabetes mellitus without complication (HCC)    Hypertension    Neuropathy     Past Surgical History:  Procedure Laterality Date   ABDOMINAL HYSTERECTOMY     CESAREAN SECTION     5    Current Medications: No outpatient medications have been marked as taking for the 08/05/22 encounter (Appointment) with Maisie Fus, MD.     Allergies:   Penicillins, Bactrim [sulfamethoxazole-trimethoprim], Sulfa antibiotics, and Keflex [cephalexin]   Social History   Socioeconomic History   Marital status: Married    Spouse name: Not on file   Number of children: Not on file   Years of education: Not on file   Highest education level: Not on file  Occupational History   Not on file  Tobacco Use   Smoking status: Never    Passive exposure: Never   Smokeless tobacco: Never  Vaping Use   Vaping Use: Never used  Substance and Sexual Activity   Alcohol use: Never   Drug use: Never   Sexual activity: Not on file  Other Topics Concern   Not on file  Social History Narrative   Not on file   Social Determinants of Health   Financial Resource Strain: Not on file  Food Insecurity: Not on file  Transportation Needs: Not on file  Physical Activity: Not on file  Stress: Not on file  Social Connections: Not on file      Family History: The patient's ***family history is not on file.  ROS:   Please see the history of present illness.    *** All other systems reviewed and are negative.  EKGs/Labs/Other Studies Reviewed:    The following studies were reviewed today: ***  EKG:  EKG is *** ordered today.  The ekg ordered today demonstrates ***  Recent Labs: 07/09/2022: BUN 15; Creatinine, Ser 0.96; Hemoglobin 15.2; Platelets 317; Potassium 3.5; Sodium 137  Recent Lipid Panel No results found for: "CHOL", "TRIG", "HDL", "CHOLHDL", "VLDL", "LDLCALC", "LDLDIRECT"   Risk Assessment/Calculations:   {Does this patient have ATRIAL FIBRILLATION?:[id number]}       Physical Exam:    VS:  There were no vitals taken for this visit.    Wt Readings from Last 3 Encounters:  07/30/22 (!) 310 lb (140.6 kg)  07/09/22 (!) 310 lb (140.6 kg)  03/29/22 (!) 320 lb (145.2 kg)     GEN: *** Well nourished, well developed in no acute distress HEENT: Normal NECK: No JVD; No carotid bruits LYMPHATICS: No lymphadenopathy CARDIAC: ***RRR, no murmurs, rubs, gallops RESPIRATORY:  Clear to auscultation without rales, wheezing or rhonchi  ABDOMEN: Soft, non-tender, non-distended MUSCULOSKELETAL:  No edema; No deformity  SKIN: Warm and dry NEUROLOGIC:  Alert and oriented x 3  PSYCHIATRIC:  Normal affect   ASSESSMENT:   Non cardiac chest pain: pain likely related to stressors. No further cardiac work up is warranted at this time. Recommend PCP FU and therapy for anxiety PLAN:    In order of problems listed above:  No further cardiac work up at this time      {Are you ordering a CV Procedure (e.g. stress test, cath, DCCV, TEE, etc)?   Press F2        :[id number]}    Medication Adjustments/Labs and Tests Ordered: Current medicines are reviewed at length with the patient today.  Concerns regarding medicines are outlined above.  No orders of the defined types were placed in this encounter.  No orders of the  defined types were placed in this encounter.   There are no Patient Instructions on file for this visit.   Signed, Maisie Fus, MD  08/03/2022 4:37 PM    [facility]

## 2022-08-05 ENCOUNTER — Ambulatory Visit: Payer: Medicaid Other | Admitting: Internal Medicine

## 2022-08-18 ENCOUNTER — Encounter: Payer: Self-pay | Admitting: Internal Medicine

## 2022-08-26 ENCOUNTER — Other Ambulatory Visit: Payer: Self-pay

## 2022-08-26 ENCOUNTER — Emergency Department (HOSPITAL_COMMUNITY)
Admission: EM | Admit: 2022-08-26 | Discharge: 2022-08-27 | Disposition: A | Discharge status: Home / Self Care | Payer: Self-pay | Attending: Emergency Medicine | Admitting: Emergency Medicine

## 2022-08-26 ENCOUNTER — Encounter (HOSPITAL_COMMUNITY): Payer: Self-pay

## 2022-08-26 ENCOUNTER — Emergency Department (HOSPITAL_COMMUNITY): Payer: Self-pay

## 2022-08-26 DIAGNOSIS — R1013 Epigastric pain: Secondary | ICD-10-CM | POA: Insufficient documentation

## 2022-08-26 DIAGNOSIS — R0602 Shortness of breath: Secondary | ICD-10-CM | POA: Insufficient documentation

## 2022-08-26 DIAGNOSIS — R6889 Other general symptoms and signs: Secondary | ICD-10-CM

## 2022-08-26 DIAGNOSIS — E119 Type 2 diabetes mellitus without complications: Secondary | ICD-10-CM | POA: Insufficient documentation

## 2022-08-26 DIAGNOSIS — Z20822 Contact with and (suspected) exposure to covid-19: Secondary | ICD-10-CM | POA: Insufficient documentation

## 2022-08-26 LAB — CBC WITH DIFFERENTIAL/PLATELET
Abs Immature Granulocytes: 0.1 10*3/uL — ABNORMAL HIGH (ref 0.00–0.07)
Basophils Absolute: 0.1 10*3/uL (ref 0.0–0.1)
Basophils Relative: 0 %
Eosinophils Absolute: 0.1 10*3/uL (ref 0.0–0.5)
Eosinophils Relative: 1 %
HCT: 48.3 % — ABNORMAL HIGH (ref 36.0–46.0)
Hemoglobin: 16.2 g/dL — ABNORMAL HIGH (ref 12.0–15.0)
Immature Granulocytes: 1 %
Lymphocytes Relative: 21 %
Lymphs Abs: 2.7 10*3/uL (ref 0.7–4.0)
MCH: 29 pg (ref 26.0–34.0)
MCHC: 33.5 g/dL (ref 30.0–36.0)
MCV: 86.4 fL (ref 80.0–100.0)
Monocytes Absolute: 0.7 10*3/uL (ref 0.1–1.0)
Monocytes Relative: 5 %
Neutro Abs: 9.6 10*3/uL — ABNORMAL HIGH (ref 1.7–7.7)
Neutrophils Relative %: 72 %
Platelets: 322 10*3/uL (ref 150–400)
RBC: 5.59 MIL/uL — ABNORMAL HIGH (ref 3.87–5.11)
RDW: 12.2 % (ref 11.5–15.5)
WBC: 13.3 10*3/uL — ABNORMAL HIGH (ref 4.0–10.5)
nRBC: 0 % (ref 0.0–0.2)

## 2022-08-26 LAB — COMPREHENSIVE METABOLIC PANEL
ALT: 32 U/L (ref 0–44)
AST: 21 U/L (ref 15–41)
Albumin: 3.9 g/dL (ref 3.5–5.0)
Alkaline Phosphatase: 64 U/L (ref 38–126)
Anion gap: 12 (ref 5–15)
BUN: 17 mg/dL (ref 6–20)
CO2: 24 mmol/L (ref 22–32)
Calcium: 9.4 mg/dL (ref 8.9–10.3)
Chloride: 101 mmol/L (ref 98–111)
Creatinine, Ser: 1.08 mg/dL — ABNORMAL HIGH (ref 0.44–1.00)
GFR, Estimated: >60 mL/min (ref >60)
Glucose, Bld: 271 mg/dL — ABNORMAL HIGH (ref 70–99)
Potassium: 3.9 mmol/L (ref 3.5–5.1)
Sodium: 137 mmol/L (ref 135–145)
Total Bilirubin: 0.8 mg/dL (ref 0.3–1.2)
Total Protein: 8 g/dL (ref 6.5–8.1)

## 2022-08-26 LAB — SARS CORONAVIRUS 2 BY RT PCR: SARS Coronavirus 2 by RT PCR: NEGATIVE

## 2022-08-26 LAB — LIPASE, BLOOD: Lipase: 32 U/L (ref 11–51)

## 2022-08-26 LAB — D-DIMER, QUANTITATIVE: D-Dimer, Quant: 0.31 ug/mL-FEU (ref 0.00–0.50)

## 2022-08-26 LAB — CBG MONITORING, ED: Glucose-Capillary: 250 mg/dL — ABNORMAL HIGH (ref 70–99)

## 2022-08-26 MED ORDER — ACETAMINOPHEN 500 MG PO TABS
1000.0000 mg | ORAL_TABLET | Freq: Once | ORAL | Status: AC
  Administered 2022-08-26: 1000 mg via ORAL
  Filled 2022-08-26: qty 2

## 2022-08-26 NOTE — ED Provider Notes (Signed)
[facility] EMERGENCY DEPARTMENT Provider Note   CSN: [id number] Arrival date & time: 08/26/22  1918     History {Add pertinent medical, surgical, social history, OB history to HPI:1} Chief Complaint  Patient presents with  . Shortness of Breath    Nicole Alvarado is a 46 y.o. female with a history significant for type 2 diabetes and hypertension presenting with a 3-day history of cough which has been nonproductive along with shortness of breath.  She reports midsternal pain with cough and deep inspiration and also endorses a headache, nausea without emesis and epigastric abdominal cramping pain.  She states she had similar symptoms when she was treated for COVID in 2021.  She has been exposed to somebody recently with similar symptoms but is unsure of their diagnosis.  She reports generalized myalgias and fatigue.  She denies vomiting, diarrhea, dysuria.  She has had subjective fever.  She has had no treatment for her symptoms prior to arrival.  The history is provided by the patient.       Home Medications Prior to Admission medications   Medication Sig Start Date End Date Taking? Authorizing Provider  azithromycin (ZITHROMAX) 250 MG tablet Take 1 tablet at bedtime starting Thursday 02/26/2022. 02/25/22   Molpus, John, MD  cefdinir (OMNICEF) 300 MG capsule Take 1 capsule (300 mg total) by mouth 2 (two) times daily. 03/29/22   Cathren Laine, MD  cetirizine-pseudoephedrine (ZYRTEC-D) 5-120 MG tablet Take 1 tablet by mouth 2 (two) times daily as needed for allergies. 03/29/22   Cathren Laine, MD  doxycycline (VIBRAMYCIN) 100 MG capsule Take 1 capsule (100 mg total) by mouth 2 (two) times daily. 03/02/22   Placido Sou, PA-C  nitrofurantoin, macrocrystal-monohydrate, (MACROBID) 100 MG capsule Take 1 capsule (100 mg total) by mouth 2 (two) times daily. 07/30/22   Teressa Lower, PA-C      Allergies    Penicillins, Bactrim [sulfamethoxazole-trimethoprim], Sulfa antibiotics, and Keflex  [cephalexin]    Review of Systems   Review of Systems  Constitutional:  Positive for fatigue. Negative for chills and fever.  HENT:  Positive for congestion. Negative for rhinorrhea, sinus pressure, sore throat, trouble swallowing and voice change.   Eyes:  Negative for discharge.  Respiratory:  Positive for cough and shortness of breath. Negative for wheezing and stridor.   Cardiovascular:  Negative for chest pain.  Gastrointestinal:  Positive for abdominal pain and nausea. Negative for constipation, diarrhea and vomiting.  Genitourinary: Negative.   Musculoskeletal:  Positive for myalgias.  All other systems reviewed and are negative.   Physical Exam Updated Vital Signs BP 124/63   Pulse 74   Temp 98.1 F (36.7 C) (Oral)   Resp 20   Ht 5\' 7"  (1.702 m)   Wt (!) 142.9 kg   SpO2 97%   BMI 49.34 kg/m  Physical Exam Vitals and nursing note reviewed.  Constitutional:      Appearance: She is well-developed.  HENT:     Head: Normocephalic and atraumatic.  Eyes:     Conjunctiva/sclera: Conjunctivae normal.  Cardiovascular:     Rate and Rhythm: Normal rate and regular rhythm.     Heart sounds: Normal heart sounds.  Pulmonary:     Effort: Pulmonary effort is normal.     Breath sounds: Normal breath sounds. No wheezing or rhonchi.  Abdominal:     General: Abdomen is protuberant. Bowel sounds are normal.     Palpations: Abdomen is soft.     Tenderness: There is abdominal tenderness  in the epigastric area. There is no guarding or rebound. Negative signs include Murphy's sign.  Musculoskeletal:        General: Normal range of motion.     Cervical back: Normal range of motion.  Skin:    General: Skin is warm and dry.  Neurological:     Mental Status: She is alert.    ED Results / Procedures / Treatments   Labs (all labs ordered are listed, but only abnormal results are displayed) Labs Reviewed  CBC WITH DIFFERENTIAL/PLATELET - Abnormal; Notable for the following  components:      Result Value   WBC 13.3 (*)    RBC 5.59 (*)    Hemoglobin 16.2 (*)    HCT 48.3 (*)    Neutro Abs 9.6 (*)    Abs Immature Granulocytes 0.10 (*)    All other components within normal limits  COMPREHENSIVE METABOLIC PANEL - Abnormal; Notable for the following components:   Glucose, Bld 271 (*)    Creatinine, Ser 1.08 (*)    All other components within normal limits  CBG MONITORING, ED - Abnormal; Notable for the following components:   Glucose-Capillary 250 (*)    All other components within normal limits  SARS CORONAVIRUS 2 BY RT PCR  LIPASE, BLOOD  D-DIMER, QUANTITATIVE    EKG None  Radiology No results found.  Procedures Procedures  {Document cardiac monitor, telemetry assessment procedure when appropriate:1}  Medications Ordered in ED Medications  acetaminophen (TYLENOL) tablet 1,000 mg (1,000 mg Oral Given 08/26/22 2201)    ED Course/ Medical Decision Making/ A&P                           Medical Decision Making Pt with multiple complaints suggesting flu like illness, but Covid negative.  Recent exposure to others with similar sx.  No acute respiratory distress, VSS, exam reassuring.   Amount and/or Complexity of Data Reviewed Labs: ordered. Radiology: ordered.  Risk OTC drugs.     {Document critical care time when appropriate:1} {Document review of labs and clinical decision tools ie heart score, Chads2Vasc2 etc:1}  {Document your independent review of radiology images, and any outside records:1} {Document your discussion with family members, caretakers, and with consultants:1} {Document social determinants of health affecting pt's care:1} {Document your decision making why or why not admission, treatments were needed:1} Final Clinical Impression(s) / ED Diagnoses Final diagnoses:  Flu-like symptoms    Rx / DC Orders ED Discharge Orders     None

## 2022-08-26 NOTE — ED Triage Notes (Signed)
Pov from home. Cc of shortness of breath for 3 days. Pt crying during triage. Says that it is painful to breathe, has a headache. States sh was around someone sick.  On phone during triage. 99% after ambulating to tx room.

## 2022-08-27 NOTE — Discharge Instructions (Signed)
Your lab tests and exam are reassuring tonight.  You do not have COVID, but I do suspect you have a viral flulike illness, we are seeing lots of patients with similar symptoms over the past week.  Rest make sure you are drinking plenty of fluids.  I encouraged taking Tylenol to help with body aches.  Get rechecked for any new or worsened symptoms.  See the referrals below for establishing local primary care.

## 2022-09-30 DIAGNOSIS — G629 Polyneuropathy, unspecified: Secondary | ICD-10-CM | POA: Insufficient documentation

## 2022-09-30 DIAGNOSIS — F419 Anxiety disorder, unspecified: Secondary | ICD-10-CM | POA: Insufficient documentation

## 2022-10-27 DIAGNOSIS — M25511 Pain in right shoulder: Secondary | ICD-10-CM | POA: Diagnosis not present

## 2022-11-04 DIAGNOSIS — E1169 Type 2 diabetes mellitus with other specified complication: Secondary | ICD-10-CM | POA: Diagnosis not present

## 2022-11-27 DIAGNOSIS — Z419 Encounter for procedure for purposes other than remedying health state, unspecified: Secondary | ICD-10-CM | POA: Diagnosis not present

## 2022-12-28 DIAGNOSIS — Z419 Encounter for procedure for purposes other than remedying health state, unspecified: Secondary | ICD-10-CM | POA: Diagnosis not present

## 2023-01-28 DIAGNOSIS — Z419 Encounter for procedure for purposes other than remedying health state, unspecified: Secondary | ICD-10-CM | POA: Diagnosis not present

## 2023-02-23 ENCOUNTER — Encounter: Payer: Self-pay | Admitting: Internal Medicine

## 2023-02-23 ENCOUNTER — Ambulatory Visit (INDEPENDENT_AMBULATORY_CARE_PROVIDER_SITE_OTHER): Payer: Medicaid Other | Admitting: Internal Medicine

## 2023-02-23 ENCOUNTER — Other Ambulatory Visit: Payer: Self-pay | Admitting: Internal Medicine

## 2023-02-23 VITALS — BP 136/82 | HR 92 | Temp 98.1°F | Resp 16 | Ht 67.0 in | Wt 330.1 lb

## 2023-02-23 DIAGNOSIS — F5101 Primary insomnia: Secondary | ICD-10-CM

## 2023-02-23 DIAGNOSIS — F3341 Major depressive disorder, recurrent, in partial remission: Secondary | ICD-10-CM

## 2023-02-23 DIAGNOSIS — Z0001 Encounter for general adult medical examination with abnormal findings: Secondary | ICD-10-CM

## 2023-02-23 DIAGNOSIS — Z6841 Body Mass Index (BMI) 40.0 and over, adult: Secondary | ICD-10-CM | POA: Diagnosis not present

## 2023-02-23 DIAGNOSIS — G4700 Insomnia, unspecified: Secondary | ICD-10-CM | POA: Insufficient documentation

## 2023-02-23 DIAGNOSIS — B349 Viral infection, unspecified: Secondary | ICD-10-CM

## 2023-02-23 DIAGNOSIS — E1140 Type 2 diabetes mellitus with diabetic neuropathy, unspecified: Secondary | ICD-10-CM | POA: Insufficient documentation

## 2023-02-23 MED ORDER — ALBUTEROL SULFATE HFA 108 (90 BASE) MCG/ACT IN AERS: 2.0000 | INHALATION_SPRAY | Freq: Four times a day (QID) | RESPIRATORY_TRACT | 0 refills | Status: DC | PRN

## 2023-02-23 MED ORDER — BUSPIRONE HCL 7.5 MG PO TABS: 7.5000 mg | ORAL_TABLET | Freq: Two times a day (BID) | ORAL | 1 refills | Status: DC

## 2023-02-23 MED ORDER — METFORMIN HCL ER 500 MG PO TB24: 500.0000 mg | ORAL_TABLET | Freq: Every day | ORAL | 3 refills | Status: DC

## 2023-02-23 MED ORDER — OZEMPIC (0.25 OR 0.5 MG/DOSE) 2 MG/3ML ~~LOC~~ SOPN: 0.5000 mg | PEN_INJECTOR | SUBCUTANEOUS | 0 refills | Status: DC

## 2023-02-23 NOTE — Patient Instructions (Signed)
Thank you, Ms.Cameron Proud for allowing [place] to provide your care today.   I have ordered the following labs for you:  Lab Orders         COVID-19, Flu A+B and RSV         Hemoglobin A1c         TSH         CMP14+EGFR         Lipid panel         VITAMIN D 25 Hydroxy (Vit-D Deficiency, Fractures)         CBC with Differential/Platelet         HCV Ab w Reflex to Quant PCR         HIV Antibody (routine testing w rflx)         Microalbumin / creatinine urine ratio       Reminders: Come back to get your fasting labs tomorrow.  I will follow-up with results.  Follow-up in 4 weeks for depression.     Tamsen Snider, M.D.

## 2023-02-23 NOTE — Assessment & Plan Note (Signed)
3 days ago she started having congestion. Wakes up at night with cough and she was wheezing.  She used her daughter's albuterol inhaler and found improvement.  No lung conditions.  No sick contacts  Plan: -Will provide patient with albuterol inhaler, if she continues to need this we will need to do more investigation of underlying lung condition -Respiratory viral test today -Continue Mucinex and nasal rinses for congestion - Follow up if not improving

## 2023-02-23 NOTE — Assessment & Plan Note (Signed)
Patient is new to our clinic and establishing care today.  She is going to have her A1c checked tomorrow with her fasting labs.  She has been on Janumet 50-500 daily. She has history of peripheral neuropathy on gabapentin. Her weight is 330 lbs and BMI is 51.  She has been unable to tolerate more than 500 mg of metformin XR.  No history of pancreatitis.  Plan: Hemoglobin A1c Patient will stop Janumet Start metformin XR Start Ozempic 0.5 mg weekly Follow up in 4 weeks

## 2023-02-23 NOTE — Assessment & Plan Note (Signed)
Patient has had depression for 7 years. She has been tried on Celexa and another medicine she can not recall. She is currently on Cymbalta. She has been on this medication for 3 years.  She is having trouble in most areas on depression screen with PHQ-9 score of 17.  She feels Cymbalta has worked well for her but is no longer controlling her symptoms.  She feels on edge.  She also finds additional benefit from Cymbalta for chronic musculoskeletal pain.  Plan: -Continue Cymbalta 60 mg -Start BuSpar 7.5 mg twice daily -Follow-up 4 weeks

## 2023-02-23 NOTE — Progress Notes (Signed)
     CC: Establish Care (Depression not well controlled. Has been on Cymbalta for awhile ), Cough (Coughing up green mucus x 3 days, some wheezing ), and Insomnia (Has chronic insomnia and takes melatonin but doesn't know if its safe to take with her meds )   HPI:Nicole Alvarado is a 47 y.o. female living with MDD, HTN, and T2DM who presents with acute concern of cough and insomnia. For the details of today's visit, please refer to the assessment and plan.  Cough:    Insomnia: Patient has insominia for one year. She has taken melatonin for one year. She relates this to her anxiety.    MDD:    T2DM:       02/23/2023    1:18 PM  Depression screen PHQ 2/9  Decreased Interest 2  Down, Depressed, Hopeless 2  PHQ - 2 Score 4  Altered sleeping 3  Tired, decreased energy 2  Change in appetite 2  Feeling bad or failure about yourself  2  Trouble concentrating 2  Moving slowly or fidgety/restless 2  Suicidal thoughts 0  PHQ-9 Score 17  Difficult doing work/chores Somewhat difficult    Depression, PHQ-9: Based on the patients  [place] Visit from 02/23/2023 in [facility]  PHQ-9 Total Score 17      score we have ***.  Past Medical History:  Diagnosis Date   Anxiety    Diabetes mellitus without complication (HCC)    Hypertension    Neuropathy     Past Surgical History:  Procedure Laterality Date   ABDOMINAL HYSTERECTOMY     CESAREAN SECTION     5    Family History  Problem Relation Age of Onset   Heart attack Mother 53   Anxiety disorder Mother    Anxiety disorder Sister    Multiple sclerosis Sister     Social History   Tobacco Use   Smoking status: Never    Passive exposure: Never   Smokeless tobacco: Never  Vaping Use   Vaping Use: Never used  Substance Use Topics   Alcohol use: Never   Drug use: Never    Review of Systems:    ROS   Physical Exam: Vitals:   02/23/23 1313  BP: 136/82  Pulse: 92   Resp: 16  SpO2: 95%  Weight: (!) 330 lb 1.9 oz (149.7 kg)  Height: 5' 7"$  (1.702 m)     Physical Exam   Assessment & Plan:   No problem-specific Assessment & Plan notes found for this encounter.    Lorene Dy, MD

## 2023-02-24 ENCOUNTER — Other Ambulatory Visit: Payer: Self-pay | Admitting: Internal Medicine

## 2023-02-24 DIAGNOSIS — Z0001 Encounter for general adult medical examination with abnormal findings: Secondary | ICD-10-CM | POA: Diagnosis not present

## 2023-02-24 NOTE — Assessment & Plan Note (Signed)
47 year old with morbid obesity. Will check baseline labs and one time screening for HIV and Hep C. She is living with T2DM and starting ozempic today. At future visits will need more counseling on diet and discussion on referral to Provider referral exercise program.

## 2023-02-24 NOTE — Assessment & Plan Note (Signed)
BMI 51. T2DM. Patient has chronic knee pain. Starting Ozempic and has follow up in 4 weeks for titration if tolerating starting dose. Counseled on diet. Continue counseling at future visits and discuss PREP program.

## 2023-02-24 NOTE — Assessment & Plan Note (Signed)
Patient has problems sleeping for month. She has problems falling asleep and staying asleep. She takes Cymbalta first thing in the morning. No afternoon energy drinks of coffee. She feels somewhat on edge and thinks its possibly related to her depression.   Plan: -Counseled on sleep routine,

## 2023-02-25 ENCOUNTER — Other Ambulatory Visit: Payer: Self-pay | Admitting: Internal Medicine

## 2023-02-25 DIAGNOSIS — E1140 Type 2 diabetes mellitus with diabetic neuropathy, unspecified: Secondary | ICD-10-CM

## 2023-02-25 LAB — CBC WITH DIFFERENTIAL/PLATELET
Basophils Absolute: 0.1 10*3/uL (ref 0.0–0.2)
Basos: 1 %
EOS (ABSOLUTE): 0.2 10*3/uL (ref 0.0–0.4)
Eos: 2 %
Hematocrit: 45.4 % (ref 34.0–46.6)
Hemoglobin: 15.1 g/dL (ref 11.1–15.9)
Immature Grans (Abs): 0.1 10*3/uL (ref 0.0–0.1)
Immature Granulocytes: 1 %
Lymphocytes Absolute: 2.6 10*3/uL (ref 0.7–3.1)
Lymphs: 29 %
MCH: 28.3 pg (ref 26.6–33.0)
MCHC: 33.3 g/dL (ref 31.5–35.7)
MCV: 85 fL (ref 79–97)
Monocytes Absolute: 0.4 10*3/uL (ref 0.1–0.9)
Monocytes: 4 %
Neutrophils Absolute: 5.8 10*3/uL (ref 1.4–7.0)
Neutrophils: 63 %
Platelets: 325 10*3/uL (ref 150–450)
RBC: 5.34 x10E6/uL — ABNORMAL HIGH (ref 3.77–5.28)
RDW: 11.6 % — ABNORMAL LOW (ref 11.7–15.4)
WBC: 9.1 10*3/uL (ref 3.4–10.8)

## 2023-02-25 LAB — CMP14+EGFR
ALT: 27 IU/L (ref 0–32)
AST: 20 IU/L (ref 0–40)
Albumin/Globulin Ratio: 1.5 (ref 1.2–2.2)
Albumin: 3.8 g/dL — ABNORMAL LOW (ref 3.9–4.9)
Alkaline Phosphatase: 72 IU/L (ref 44–121)
BUN/Creatinine Ratio: 16 (ref 9–23)
BUN: 14 mg/dL (ref 6–24)
Bilirubin Total: 0.4 mg/dL (ref 0.0–1.2)
CO2: 25 mmol/L (ref 20–29)
Calcium: 9.4 mg/dL (ref 8.7–10.2)
Chloride: 100 mmol/L (ref 96–106)
Creatinine, Ser: 0.85 mg/dL (ref 0.57–1.00)
Globulin, Total: 2.6 g/dL (ref 1.5–4.5)
Glucose: 285 mg/dL — ABNORMAL HIGH (ref 70–99)
Potassium: 4.6 mmol/L (ref 3.5–5.2)
Sodium: 138 mmol/L (ref 134–144)
Total Protein: 6.4 g/dL (ref 6.0–8.5)
eGFR: 86 mL/min/{1.73_m2} (ref >59)

## 2023-02-25 LAB — COVID-19, FLU A+B AND RSV
Influenza A, NAA: NOT DETECTED
Influenza B, NAA: NOT DETECTED
RSV, NAA: NOT DETECTED
SARS-CoV-2, NAA: NOT DETECTED

## 2023-02-25 LAB — HEMOGLOBIN A1C
Est. average glucose Bld gHb Est-mCnc: 249 mg/dL
Hgb A1c MFr Bld: 10.3 % — ABNORMAL HIGH (ref 4.8–5.6)

## 2023-02-25 LAB — HCV AB W REFLEX TO QUANT PCR: HCV Ab: NONREACTIVE

## 2023-02-25 LAB — LIPID PANEL
Chol/HDL Ratio: 4 ratio (ref 0.0–4.4)
Cholesterol, Total: 196 mg/dL (ref 100–199)
HDL: 49 mg/dL (ref >39)
LDL Chol Calc (NIH): 129 mg/dL — ABNORMAL HIGH (ref 0–99)
Triglycerides: 101 mg/dL (ref 0–149)
VLDL Cholesterol Cal: 18 mg/dL (ref 5–40)

## 2023-02-25 LAB — HIV ANTIBODY (ROUTINE TESTING W REFLEX): HIV Screen 4th Generation wRfx: NONREACTIVE

## 2023-02-25 LAB — VITAMIN D 25 HYDROXY (VIT D DEFICIENCY, FRACTURES): Vit D, 25-Hydroxy: 7 ng/mL — ABNORMAL LOW (ref 30.0–100.0)

## 2023-02-25 LAB — TSH: TSH: 0.871 u[IU]/mL (ref 0.450–4.500)

## 2023-02-25 LAB — HCV INTERPRETATION

## 2023-02-25 MED ORDER — GABAPENTIN 100 MG PO CAPS: 100.0000 mg | ORAL_CAPSULE | Freq: Three times a day (TID) | ORAL | 0 refills | Status: DC

## 2023-02-25 NOTE — Progress Notes (Signed)
Refill sent  - gabapentin (NEURONTIN) 100 MG capsule; Take 1 capsule (100 mg total) by mouth 3 (three) times daily. Three times daily  Dispense: 270 capsule; Refill: 0

## 2023-02-26 DIAGNOSIS — Z419 Encounter for procedure for purposes other than remedying health state, unspecified: Secondary | ICD-10-CM | POA: Diagnosis not present

## 2023-02-26 LAB — MICROALBUMIN / CREATININE URINE RATIO
Creatinine, Urine: 67.1 mg/dL
Microalb/Creat Ratio: 56 mg/g creat — ABNORMAL HIGH (ref 0–29)
Microalbumin, Urine: 37.5 ug/mL

## 2023-03-01 ENCOUNTER — Other Ambulatory Visit: Payer: Self-pay | Admitting: Internal Medicine

## 2023-03-01 DIAGNOSIS — E1140 Type 2 diabetes mellitus with diabetic neuropathy, unspecified: Secondary | ICD-10-CM

## 2023-03-01 DIAGNOSIS — E559 Vitamin D deficiency, unspecified: Secondary | ICD-10-CM

## 2023-03-01 DIAGNOSIS — I10 Essential (primary) hypertension: Secondary | ICD-10-CM

## 2023-03-01 MED ORDER — OLMESARTAN MEDOXOMIL 20 MG PO TABS: 20.0000 mg | ORAL_TABLET | Freq: Every day | ORAL | 0 refills | Status: DC

## 2023-03-01 MED ORDER — BLOOD GLUCOSE MONITOR KIT: PACK | 0 refills | Status: DC

## 2023-03-01 MED ORDER — CHOLECALCIFEROL 1.25 MG (50000 UT) PO CAPS: 50000.0000 [IU] | ORAL_CAPSULE | ORAL | 0 refills | Status: DC

## 2023-03-01 MED ORDER — ACCU-CHEK SOFTCLIX LANCETS MISC: 12 refills | Status: AC

## 2023-03-01 MED ORDER — ACCU-CHEK GUIDE VI STRP: ORAL_STRIP | 12 refills | Status: DC

## 2023-03-01 MED ORDER — ACCU-CHEK GUIDE ME W/DEVICE KIT: 1.0000 | PACK | Freq: Four times a day (QID) | 0 refills | Status: DC

## 2023-03-01 NOTE — Addendum Note (Signed)
Addended by: Lyndal Pulley on: 03/01/2023 01:33 PM   Modules accepted: Orders

## 2023-03-01 NOTE — Progress Notes (Signed)
Patient will stop amlodipine. Start Olmesartan for HTN and albuminuria. Vitamin D supplement sent to pharmacy and glucometer. Follow up scheduled 03/23/23.

## 2023-03-05 ENCOUNTER — Encounter: Payer: Self-pay | Admitting: Internal Medicine

## 2023-03-10 ENCOUNTER — Ambulatory Visit (INDEPENDENT_AMBULATORY_CARE_PROVIDER_SITE_OTHER): Payer: Medicaid Other | Admitting: Internal Medicine

## 2023-03-10 ENCOUNTER — Encounter: Payer: Self-pay | Admitting: Internal Medicine

## 2023-03-10 VITALS — BP 109/79 | HR 109 | Resp 16 | Ht 67.5 in | Wt 323.0 lb

## 2023-03-10 DIAGNOSIS — E1140 Type 2 diabetes mellitus with diabetic neuropathy, unspecified: Secondary | ICD-10-CM | POA: Diagnosis not present

## 2023-03-10 DIAGNOSIS — F3341 Major depressive disorder, recurrent, in partial remission: Secondary | ICD-10-CM

## 2023-03-10 DIAGNOSIS — I10 Essential (primary) hypertension: Secondary | ICD-10-CM | POA: Diagnosis not present

## 2023-03-10 DIAGNOSIS — N3001 Acute cystitis with hematuria: Secondary | ICD-10-CM | POA: Diagnosis not present

## 2023-03-10 LAB — POCT URINALYSIS DIP (CLINITEK)
Bilirubin, UA: NEGATIVE
Glucose, UA: NEGATIVE mg/dL
Ketones, POC UA: NEGATIVE mg/dL
Nitrite, UA: NEGATIVE
POC PROTEIN,UA: NEGATIVE
Spec Grav, UA: 1.02 (ref 1.010–1.025)
Urobilinogen, UA: 1 E.U./dL
pH, UA: 5.5 (ref 5.0–8.0)

## 2023-03-10 MED ORDER — OZEMPIC (0.25 OR 0.5 MG/DOSE) 2 MG/3ML ~~LOC~~ SOPN: 0.5000 mg | PEN_INJECTOR | SUBCUTANEOUS | 0 refills | Status: DC

## 2023-03-10 MED ORDER — NITROFURANTOIN MONOHYD MACRO 100 MG PO CAPS: 100.0000 mg | ORAL_CAPSULE | Freq: Two times a day (BID) | ORAL | 0 refills | Status: DC

## 2023-03-10 NOTE — Patient Instructions (Signed)
Thank you for trusting me with your care. To recap, today we discussed the following:  Thank you, Ms.Cameron Proud for allowing [place] to provide your care today.   I have ordered the following labs for you:   Lab Orders         Urine Culture         BMP8+EGFR         POCT URINALYSIS DIP (CLINITEK)      Referrals ordered today:    Referral Orders         Ambulatory referral to [place]       Reminders: Follow up with Dr.Dixon on 3/26    Tamsen Snider, M.D.

## 2023-03-10 NOTE — Progress Notes (Unsigned)
HPI:Nicole Alvarado is a 47 y.o. female who presents for evaluation of possible UTI and follow up of chronic medical problems. She has dysuria, urgency and frequency x 4 days. Some lower abdominal pain and pain in left flank area. Has history of recurrent UTI's when pregnant , but not UTI in 49 years.    Physical Exam: Vitals:   03/10/23 1608  BP: 109/79  Pulse: (!) 109  Resp: 16  SpO2: 95%  Weight: (!) 323 lb (146.5 kg)  Height: 5' 7.5" (1.715 m)     Physical Exam Constitutional:      General: She is not in acute distress.    Appearance: She is not ill-appearing.  HENT:     Right Ear: Tympanic membrane normal.     Left Ear: Tympanic membrane normal.  Cardiovascular:     Rate and Rhythm: Normal rate and regular rhythm.  Abdominal:     Tenderness: There is abdominal tenderness (suprapubic). There is no right CVA tenderness or left CVA tenderness.  Psychiatric:        Attention and Perception: Attention normal.        Mood and Affect: Affect normal. Mood is depressed.        Speech: Speech normal.        Behavior: Behavior normal.        Thought Content: Thought content normal.      Assessment & Plan:   Nicole Alvarado was seen today for urinary tract infection.  Acute cystitis with hematuria Assessment & Plan: POC UA shows trace blood and positive leukocytes. Patient symptoms consistent with previous UTI. Suprapubic pain. Mild tachycardia on vitals , improved on my exam. Multiple allergies to antibiotics. Macrobid x 5 days Follow up if symptoms worsen or fail to improve   Orders: -     POCT URINALYSIS DIP (CLINITEK) -     Urine Culture -     Nitrofurantoin Monohyd Macro; Take 1 capsule (100 mg total) by mouth 2 (two) times daily.  Dispense: 10 capsule; Refill: 0  Type 2 diabetes mellitus with diabetic neuropathy, without long-term current use of insulin (HCC) Assessment & Plan: Currently taking 500 mg of Metformin. Started Ozempic at last visit. Hemoglobin A1c  10.3 at the visit. Tolerating .5 mg dose of Ozempic. Has 7 lbs of weight loss in 3 weeks.This has motivated her and she cut out soft drinks. She needs refill today because she damage pen. She has follow up with PCP in a few weeks.  Continue current dose of Ozempic until follow up with PCP Continue Metformin 500 mg daily  Orders: -     Ozempic (0.25 or 0.5 MG/DOSE); Inject 0.5 mg into the skin once a week.  Dispense: 3 mL; Refill: 0  Recurrent major depressive disorder, in partial remission ([place]) Assessment & Plan: Patient has not found benefit in Buspar started a few weeks ago. She is at 7.5 mg BID as adjunct to Cymbalta.She finds herself getting very short and agitated. This has been a problem for years. She had a counselor before moving to [place]. Will refer to [facility]. She has follow up with PCP and we discussed giving Buspar a try until then. She will discuss increasing dose or change in medication if needed at next visit.     Orders: -     Amb ref to [place]  Primary hypertension Assessment & Plan: Started Olmesartan for HTN and albuminuria after last visit.Stopped amlodipine. BP today 109/79. No light headed episodes.  Continue  Olmesartan 20 mg Check BMP  Orders: -     BMP8+EGFR  Morbid obesity ([place]) Assessment & Plan: Patient has 7lb wieght loss since last visit. Cut out sodas and started Ozempic. Encouraged on progress.        Lorene Dy, MD

## 2023-03-11 ENCOUNTER — Encounter: Payer: Self-pay | Admitting: Internal Medicine

## 2023-03-11 DIAGNOSIS — I10 Essential (primary) hypertension: Secondary | ICD-10-CM | POA: Insufficient documentation

## 2023-03-11 DIAGNOSIS — N3001 Acute cystitis with hematuria: Secondary | ICD-10-CM | POA: Insufficient documentation

## 2023-03-11 DIAGNOSIS — N3000 Acute cystitis without hematuria: Secondary | ICD-10-CM | POA: Insufficient documentation

## 2023-03-11 DIAGNOSIS — N390 Urinary tract infection, site not specified: Secondary | ICD-10-CM | POA: Insufficient documentation

## 2023-03-11 LAB — BMP8+EGFR
BUN/Creatinine Ratio: 19 (ref 9–23)
BUN: 19 mg/dL (ref 6–24)
CO2: 24 mmol/L (ref 20–29)
Calcium: 9.9 mg/dL (ref 8.7–10.2)
Chloride: 103 mmol/L (ref 96–106)
Creatinine, Ser: 1.02 mg/dL — ABNORMAL HIGH (ref 0.57–1.00)
Glucose: 178 mg/dL — ABNORMAL HIGH (ref 70–99)
Potassium: 4.3 mmol/L (ref 3.5–5.2)
Sodium: 139 mmol/L (ref 134–144)
eGFR: 69 mL/min/{1.73_m2} (ref >59)

## 2023-03-11 NOTE — Assessment & Plan Note (Signed)
Patient has 7lb wieght loss since last visit. Cut out sodas and started Ozempic. Encouraged on progress.

## 2023-03-11 NOTE — Assessment & Plan Note (Addendum)
Started Olmesartan for HTN and albuminuria after last visit.Stopped amlodipine. BP today 109/79. No light headed episodes.  Continue Olmesartan 20 mg Check BMP

## 2023-03-11 NOTE — Assessment & Plan Note (Signed)
Currently taking 500 mg of Metformin. Started Ozempic at last visit. Hemoglobin A1c 10.3 at the visit. Tolerating .5 mg dose of Ozempic. Has 7 lbs of weight loss in 3 weeks.This has motivated her and she cut out soft drinks. She needs refill today because she damage pen. She has follow up with PCP in a few weeks.  Continue current dose of Ozempic until follow up with PCP Continue Metformin 500 mg daily

## 2023-03-11 NOTE — Assessment & Plan Note (Signed)
Patient has not found benefit in Buspar started a few weeks ago. She is at 7.5 mg BID as adjunct to Cymbalta.She finds herself getting very short and agitated. This has been a problem for years. She had a counselor before moving to [place]. Will refer to [facility]. She has follow up with PCP and we discussed giving Buspar a try until then. She will discuss increasing dose or change in medication if needed at next visit.

## 2023-03-11 NOTE — Assessment & Plan Note (Signed)
POC UA shows trace blood and positive leukocytes. Patient symptoms consistent with previous UTI. Suprapubic pain. Mild tachycardia on vitals , improved on my exam. Multiple allergies to antibiotics. Macrobid x 5 days Follow up if symptoms worsen or fail to improve

## 2023-03-15 ENCOUNTER — Encounter: Payer: Self-pay | Admitting: Internal Medicine

## 2023-03-15 LAB — URINE CULTURE

## 2023-03-23 ENCOUNTER — Encounter: Payer: Self-pay | Admitting: *Deleted

## 2023-03-23 ENCOUNTER — Encounter: Payer: Self-pay | Admitting: Internal Medicine

## 2023-03-23 ENCOUNTER — Ambulatory Visit (INDEPENDENT_AMBULATORY_CARE_PROVIDER_SITE_OTHER): Payer: Medicaid Other | Admitting: Internal Medicine

## 2023-03-23 VITALS — BP 100/75 | HR 96 | Ht 67.5 in | Wt 317.8 lb

## 2023-03-23 DIAGNOSIS — E559 Vitamin D deficiency, unspecified: Secondary | ICD-10-CM | POA: Insufficient documentation

## 2023-03-23 DIAGNOSIS — I10 Essential (primary) hypertension: Secondary | ICD-10-CM

## 2023-03-23 DIAGNOSIS — E1140 Type 2 diabetes mellitus with diabetic neuropathy, unspecified: Secondary | ICD-10-CM

## 2023-03-23 DIAGNOSIS — Z1211 Encounter for screening for malignant neoplasm of colon: Secondary | ICD-10-CM

## 2023-03-23 DIAGNOSIS — Z1231 Encounter for screening mammogram for malignant neoplasm of breast: Secondary | ICD-10-CM | POA: Diagnosis not present

## 2023-03-23 DIAGNOSIS — F3341 Major depressive disorder, recurrent, in partial remission: Secondary | ICD-10-CM

## 2023-03-23 MED ORDER — DULOXETINE HCL 30 MG PO CPEP: 30.0000 mg | ORAL_CAPSULE | Freq: Every day | ORAL | 2 refills | Status: DC

## 2023-03-23 MED ORDER — DULOXETINE HCL 60 MG PO CPEP: 60.0000 mg | ORAL_CAPSULE | Freq: Every day | ORAL | 2 refills | Status: DC

## 2023-03-23 MED ORDER — SEMAGLUTIDE (1 MG/DOSE) 4 MG/3ML ~~LOC~~ SOPN: 1.0000 mg | PEN_INJECTOR | SUBCUTANEOUS | 1 refills | Status: DC

## 2023-03-23 NOTE — Patient Instructions (Signed)
It was a pleasure to see you today.  Thank you for giving [place] the opportunity to be involved in your care.  Below is a brief recap of your visit and next steps.  We will plan to see you again in 4 weeks.  Summary Increase Ozempic to 1 mg weekly Increase Cymbalta 90 mg daily We will follow up in 4 weeks to reassess your mood

## 2023-03-23 NOTE — Assessment & Plan Note (Signed)
Gastroenterology referral placed today

## 2023-03-23 NOTE — Assessment & Plan Note (Signed)
Noted on recent labs.  She is currently on high-dose, weekly vitamin D supplementation. -Repeat vitamin D level upon completion of weekly vitamin D supplementation

## 2023-03-23 NOTE — Assessment & Plan Note (Signed)
BuSpar recently added for improved treatment of ADHD.  She is additionally prescribed Cymbalta 60 mg daily.  She continues to endorse feeling short tempered and has episodes of crying during the day.  She is interested in additional treatment options.  Denies SI/HI currently. -Discontinue BuSpar -Increase Cymbalta to 90 mg daily -Follow-up in 4 weeks for reassessment.

## 2023-03-23 NOTE — Assessment & Plan Note (Signed)
A1c 10.3 on labs from last month.  She is currently prescribed Ozempic 0.5 mg weekly and metformin XR 500 mg daily.  She is checking her blood sugar regularly and reports fasting readings around 150. -Increase Ozempic to 1 mg weekly -Continue metformin XR 500 mg daily

## 2023-03-23 NOTE — Progress Notes (Signed)
Established Patient Office Visit  Subjective   Patient ID: Nicole Alvarado, female    DOB: Apr 30, 1976  Age: 47 y.o. MRN: [id number]  Chief Complaint  Patient presents with   Diabetes    Follow up   Nicole Alvarado returns to care today for 4-week follow-up of T2DM and MDD.  She was evaluated by Dr. Court Joy as a new patient presenting to establish care on 2/27.  Ozempic 0.5 mg weekly was started, she was switched to metformin XR, and BuSpar 7.5 mg twice daily was added as well.  In the interim she presented for an acute visit on 3/13 endorsing symptoms concerning for UTI.  She was prescribed Macrobid for treatment.  There have otherwise been no acute interval events.  Nicole Alvarado reports feeling fairly well today.  She has lost 13 pounds since starting Ozempic and is motivated to continue losing weight.  Acute concerns today are requesting a referral to gastroenterology for screening colonoscopy.  She additionally Dors is pain in her left shoulder has been present for the past day.  She believes that she slept on her left side and that is what has caused her lateral shoulder pain.  She has no additional concerns to discuss today.  Past Medical History:  Diagnosis Date   Anxiety    Diabetes mellitus without complication (HCC)    Hypertension    Neuropathy    Past Surgical History:  Procedure Laterality Date   CESAREAN SECTION     5   TOTAL ABDOMINAL HYSTERECTOMY     Social History   Tobacco Use   Smoking status: Never    Passive exposure: Never   Smokeless tobacco: Never  Vaping Use   Vaping Use: Never used  Substance Use Topics   Alcohol use: Never   Drug use: Never   Family History  Problem Relation Age of Onset   Heart attack Mother 51   Anxiety disorder Mother    Anxiety disorder Sister    Multiple sclerosis Sister    Allergies  Allergen Reactions   Penicillins Anaphylaxis   Bactrim [Sulfamethoxazole-Trimethoprim] Hives   Sulfa Antibiotics Hives   Keflex [Cephalexin]  Rash   Review of Systems  Musculoskeletal:  Positive for joint pain (Left shoulder).  Psychiatric/Behavioral:  Positive for depression.   All other systems reviewed and are negative.    Objective:     BP 100/75   Pulse 96   Ht 5' 7.5" (1.715 m)   Wt (!) 317 lb 12.8 oz (144.2 kg)   SpO2 97%   BMI 49.04 kg/m  BP Readings from Last 3 Encounters:  03/23/23 100/75  03/10/23 109/79  02/23/23 136/82   Physical Exam Vitals reviewed.  Constitutional:      General: She is not in acute distress.    Appearance: Normal appearance. She is obese. She is not toxic-appearing.  HENT:     Head: Normocephalic and atraumatic.     Right Ear: External ear normal.     Left Ear: External ear normal.     Nose: Nose normal. No congestion or rhinorrhea.     Mouth/Throat:     Mouth: Mucous membranes are moist.     Pharynx: Oropharynx is clear. No oropharyngeal exudate or posterior oropharyngeal erythema.  Eyes:     General: No scleral icterus.    Extraocular Movements: Extraocular movements intact.     Conjunctiva/sclera: Conjunctivae normal.     Pupils: Pupils are equal, round, and reactive to light.  Cardiovascular:  Rate and Rhythm: Normal rate and regular rhythm.     Pulses: Normal pulses.     Heart sounds: Normal heart sounds. No murmur heard.    No friction rub. No gallop.  Pulmonary:     Effort: Pulmonary effort is normal.     Breath sounds: Normal breath sounds. No wheezing, rhonchi or rales.  Abdominal:     General: Abdomen is flat. Bowel sounds are normal. There is no distension.     Palpations: Abdomen is soft.     Tenderness: There is no abdominal tenderness.  Musculoskeletal:        General: No swelling. Normal range of motion.     Cervical back: Normal range of motion.     Right lower leg: No edema.     Left lower leg: No edema.  Lymphadenopathy:     Cervical: No cervical adenopathy.  Skin:    General: Skin is warm and dry.     Capillary Refill: Capillary refill  takes less than 2 seconds.     Coloration: Skin is not jaundiced.  Neurological:     General: No focal deficit present.     Mental Status: She is alert and oriented to person, place, and time.  Psychiatric:        Mood and Affect: Mood normal.        Behavior: Behavior normal.   Last CBC Lab Results  Component Value Date   WBC 9.1 02/24/2023   HGB 15.1 02/24/2023   HCT 45.4 02/24/2023   MCV 85 02/24/2023   MCH 28.3 02/24/2023   RDW 11.6 (L) 02/24/2023   PLT 325 [id number]   Last metabolic panel Lab Results  Component Value Date   GLUCOSE 178 (H) 03/10/2023   NA 139 03/10/2023   K 4.3 03/10/2023   CL 103 03/10/2023   CO2 24 03/10/2023   BUN 19 03/10/2023   CREATININE 1.02 (H) 03/10/2023   EGFR 69 03/10/2023   CALCIUM 9.9 03/10/2023   PROT 6.4 02/24/2023   ALBUMIN 3.8 (L) 02/24/2023   LABGLOB 2.6 02/24/2023   AGRATIO 1.5 02/24/2023   BILITOT 0.4 02/24/2023   ALKPHOS 72 02/24/2023   AST 20 02/24/2023   ALT 27 02/24/2023   ANIONGAP 12 08/26/2022   Last lipids Lab Results  Component Value Date   CHOL 196 02/24/2023   HDL 49 02/24/2023   LDLCALC 129 (H) 02/24/2023   TRIG 101 02/24/2023   CHOLHDL 4.0 02/24/2023   Last hemoglobin A1c Lab Results  Component Value Date   HGBA1C 10.3 (H) 02/24/2023   Last thyroid functions Lab Results  Component Value Date   TSH 0.871 02/24/2023   Last vitamin D Lab Results  Component Value Date   VD25OH 7.0 (L) 02/24/2023   The 10-year ASCVD risk score (Arnett DK, et al., 2019) is: 1.5%    Assessment & Plan:   Problem List Items Addressed This Visit       HTN (hypertension)    Olmesartan 20 mg daily recently admitted for treatment of hypertension in the setting of albuminuria.  Amlodipine was discontinued.  Her BP today is 100/75. -No medication changes today.  Continue olmesartan 20 mg daily.      Type 2 diabetes mellitus with diabetic neuropathy, without long-term current use of insulin (HCC)    A1c 10.3 on labs  from last month.  She is currently prescribed Ozempic 0.5 mg weekly and metformin XR 500 mg daily.  She is checking her blood sugar regularly and reports  fasting readings around 150. -Increase Ozempic to 1 mg weekly -Continue metformin XR 500 mg daily      Recurrent major depressive disorder, in partial remission ([place])    BuSpar recently added for improved treatment of ADHD.  She is additionally prescribed Cymbalta 60 mg daily.  She continues to endorse feeling short tempered and has episodes of crying during the day.  She is interested in additional treatment options.  Denies SI/HI currently. -Discontinue BuSpar -Increase Cymbalta to 90 mg daily -Follow-up in 4 weeks for reassessment.      Vitamin D deficiency    Noted on recent labs.  She is currently on high-dose, weekly vitamin D supplementation. -Repeat vitamin D level upon completion of weekly vitamin D supplementation      Colon cancer screening    Gastroenterology referral placed today      Return in about 4 weeks (around 04/20/2023) for MDD.   Johnette Abraham, MD

## 2023-03-23 NOTE — Assessment & Plan Note (Signed)
Olmesartan 20 mg daily recently admitted for treatment of hypertension in the setting of albuminuria.  Amlodipine was discontinued.  Her BP today is 100/75. -No medication changes today.  Continue olmesartan 20 mg daily.

## 2023-03-29 DIAGNOSIS — Z419 Encounter for procedure for purposes other than remedying health state, unspecified: Secondary | ICD-10-CM | POA: Diagnosis not present

## 2023-03-31 ENCOUNTER — Telehealth: Payer: Self-pay | Admitting: Internal Medicine

## 2023-03-31 NOTE — Telephone Encounter (Signed)
Called pt and tried to set up referral for [facility].  If she calls back please ask what thurs or Friday 4/18 or 4/19 she would be available and I can schedule it.  The slots are filling fast so it will not last long.   I have [place] has called 3 times and LMOM to call to set up appointment.  We will just leave her in [place] since they were not able to reach her.

## 2023-04-06 ENCOUNTER — Telehealth: Payer: Medicaid Other | Admitting: Family Medicine

## 2023-04-06 ENCOUNTER — Encounter: Payer: Self-pay | Admitting: Internal Medicine

## 2023-04-06 DIAGNOSIS — R3989 Other symptoms and signs involving the genitourinary system: Secondary | ICD-10-CM

## 2023-04-06 MED ORDER — NITROFURANTOIN MONOHYD MACRO 100 MG PO CAPS: 100.0000 mg | ORAL_CAPSULE | Freq: Two times a day (BID) | ORAL | 0 refills | Status: DC

## 2023-04-06 NOTE — Progress Notes (Signed)

## 2023-04-09 ENCOUNTER — Institutional Professional Consult (permissible substitution): Payer: Medicaid Other | Admitting: Professional Counselor

## 2023-04-14 ENCOUNTER — Encounter: Payer: Self-pay | Admitting: Internal Medicine

## 2023-04-14 DIAGNOSIS — N3000 Acute cystitis without hematuria: Secondary | ICD-10-CM

## 2023-04-15 MED ORDER — CIPROFLOXACIN HCL 250 MG PO TABS: 250.0000 mg | ORAL_TABLET | Freq: Two times a day (BID) | ORAL | 0 refills | Status: AC

## 2023-04-20 ENCOUNTER — Ambulatory Visit: Payer: Medicaid Other | Admitting: Internal Medicine

## 2023-04-23 ENCOUNTER — Ambulatory Visit (INDEPENDENT_AMBULATORY_CARE_PROVIDER_SITE_OTHER): Payer: Medicaid Other | Admitting: Internal Medicine

## 2023-04-23 VITALS — BP 108/70 | HR 77 | Ht 67.5 in | Wt 314.2 lb

## 2023-04-23 DIAGNOSIS — E1140 Type 2 diabetes mellitus with diabetic neuropathy, unspecified: Secondary | ICD-10-CM

## 2023-04-23 DIAGNOSIS — Z7985 Long-term (current) use of injectable non-insulin antidiabetic drugs: Secondary | ICD-10-CM | POA: Diagnosis not present

## 2023-04-23 DIAGNOSIS — E785 Hyperlipidemia, unspecified: Secondary | ICD-10-CM | POA: Diagnosis not present

## 2023-04-23 DIAGNOSIS — R300 Dysuria: Secondary | ICD-10-CM | POA: Diagnosis not present

## 2023-04-23 DIAGNOSIS — N3000 Acute cystitis without hematuria: Secondary | ICD-10-CM | POA: Diagnosis not present

## 2023-04-23 DIAGNOSIS — N390 Urinary tract infection, site not specified: Secondary | ICD-10-CM

## 2023-04-23 DIAGNOSIS — F3341 Major depressive disorder, recurrent, in partial remission: Secondary | ICD-10-CM

## 2023-04-23 DIAGNOSIS — E559 Vitamin D deficiency, unspecified: Secondary | ICD-10-CM

## 2023-04-23 DIAGNOSIS — Z1211 Encounter for screening for malignant neoplasm of colon: Secondary | ICD-10-CM

## 2023-04-23 DIAGNOSIS — E1169 Type 2 diabetes mellitus with other specified complication: Secondary | ICD-10-CM | POA: Insufficient documentation

## 2023-04-23 LAB — POCT URINALYSIS DIP (CLINITEK)
Bilirubin, UA: NEGATIVE
Blood, UA: NEGATIVE
Glucose, UA: NEGATIVE mg/dL
Ketones, POC UA: NEGATIVE mg/dL
Nitrite, UA: NEGATIVE
POC PROTEIN,UA: NEGATIVE
Spec Grav, UA: 1.02 (ref 1.010–1.025)
Urobilinogen, UA: 0.2 E.U./dL
pH, UA: 5.5 (ref 5.0–8.0)

## 2023-04-23 MED ORDER — ATORVASTATIN CALCIUM 20 MG PO TABS: 20.0000 mg | ORAL_TABLET | Freq: Every day | ORAL | 3 refills | Status: DC

## 2023-04-23 MED ORDER — NITROFURANTOIN MONOHYD MACRO 100 MG PO CAPS: 100.0000 mg | ORAL_CAPSULE | Freq: Two times a day (BID) | ORAL | 0 refills | Status: AC

## 2023-04-23 MED ORDER — SEMAGLUTIDE (1 MG/DOSE) 4 MG/3ML ~~LOC~~ SOPN
1.0000 mg | PEN_INJECTOR | SUBCUTANEOUS | 0 refills | Status: DC
Start: 2023-04-23 — End: 2023-07-13

## 2023-04-23 NOTE — Assessment & Plan Note (Signed)
She remains on high-dose, weekly vitamin D supplementation.  Will plan to repeat her vitamin D level at follow-up in 1 month.

## 2023-04-23 NOTE — Assessment & Plan Note (Addendum)
She continues to endorse dysuria and lower abdominal discomfort.  Symptoms resolved after she is treated with antibiotics, but quickly returned.  UA today is positive for leukocyte esterase.  She has most recently grown E. coli resistant to ampicillin and with intermediate resistance to Augmentin.  She has been treated with Macrobid x 2 recently as well as Cipro x 3 days. -Treat again with Macrobid x 5 days for UTI -Follow-up urine culture -Given her history of recurrent UTI despite treatment with appropriate antibiotics for an adequate duration, will refer to urology for further evaluation and management

## 2023-04-23 NOTE — Assessment & Plan Note (Signed)
A1c 10.3 in February.  Ozempic was increased to 1 mg weekly at her last appointment.  She is additionally prescribed metformin XR 500 mg daily.  She has not experienced any adverse side effects with increasing Ozempic. -Increase Ozempic to 2 mg weekly after she completes four 1 mg injections -Repeat A1c at follow-up in 1 month

## 2023-04-23 NOTE — Patient Instructions (Signed)
It was a pleasure to see you today.  Thank you for giving [place] the opportunity to be involved in your care.  Below is a brief recap of your visit and next steps.  We will plan to see you again in 1 month.  Summary Start [facility] Urology referral placed Continue Cymbalta Let us know after you complete your third 1 mg Ozempic injection Follow up in 1 month

## 2023-04-23 NOTE — Assessment & Plan Note (Signed)
She has previously been referred to gastroenterology for screening colonoscopy.  A letter was mailed to her home, however her address is changed.  We will update her contact information today.

## 2023-04-23 NOTE — Assessment & Plan Note (Signed)
Lipid panel updated in February.  Total cholesterol 196 and LDL 129.  Her 10-year ASCVD risk score is 1.7%. -Start atorvastatin 20 mg daily for improved LDL control

## 2023-04-23 NOTE — Assessment & Plan Note (Signed)
Returning to care today for MDD follow-up.  BuSpar was discontinued at her last appointment and Cymbalta was increased to 90 mg daily.  She states that this has significantly improved her mood.  She is not interested in making any additional medication changes today. -Continue Cymbalta 90 g daily

## 2023-04-23 NOTE — Progress Notes (Signed)
Established Patient Office Visit  Subjective   Patient ID: Nicole Alvarado, female    DOB: [birth year]  Age: 47 y.o. MRN: [id number]  Chief Complaint  Patient presents with   Urinary Tract Infection    Patient states she has been on two different antibiotics, but is still having symptoms of a uti   Nicole Alvarado returns to care today for follow-up.  She was last evaluated by me on 3/26 at which time BuSpar was discontinued and Cymbalta was increased to 90 mg daily.  Ozempic was also increased to 1 mg weekly.  In the interim she was evaluated through telehealth for over UTI symptoms and was again treated with Macrobid.  Despite adequate treatment with Macrobid, her symptoms persisted and she has more recently been treated with Cipro x 3 days.  There have otherwise been no acute interval events.  Today Nicole Alvarado continues to endorse symptoms of dysuria and lower abdominal discomfort.  She states that her symptoms resolved when she is taking antibiotics but quickly returned after she finishes them.  She endorses a history of recurrent UTI, noting that she had to take Macrobid for the duration of her 4 pregnancies.  She has never been evaluated by urology.  She is otherwise asymptomatic and has no additional concerns to discuss today.  Past Medical History:  Diagnosis Date   Anxiety    Diabetes mellitus without complication (HCC)    Hypertension    Neuropathy    Past Surgical History:  Procedure Laterality Date   CESAREAN SECTION     5   TOTAL ABDOMINAL HYSTERECTOMY     Social History   Tobacco Use   Smoking status: Never    Passive exposure: Never   Smokeless tobacco: Never  Vaping Use   Vaping Use: Never used  Substance Use Topics   Alcohol use: Never   Drug use: Never   Family History  Problem Relation Age of Onset   Heart attack Mother 6   Anxiety disorder Mother    Anxiety disorder Sister    Multiple sclerosis Sister    Allergies  Allergen Reactions   Penicillins  Anaphylaxis   Bactrim [Sulfamethoxazole-Trimethoprim] Hives   Sulfa Antibiotics Hives   Keflex [Cephalexin] Rash   Review of Systems  Gastrointestinal:  Positive for abdominal pain (Lower abdominal discomfort).  Genitourinary:  Positive for dysuria.  All other systems reviewed and are negative.    Objective:     BP 108/70   Pulse 77   Ht 5' 7.5" (1.715 m)   Wt (!) 314 lb 3.2 oz (142.5 kg)   SpO2 97%   BMI 48.48 kg/m  BP Readings from Last 3 Encounters:  04/23/23 108/70  03/23/23 100/75  03/10/23 109/79   Physical Exam Vitals reviewed.  Constitutional:      General: She is not in acute distress.    Appearance: Normal appearance. She is obese. She is not toxic-appearing.  HENT:     Head: Normocephalic and atraumatic.     Right Ear: External ear normal.     Left Ear: External ear normal.     Nose: Nose normal. No congestion or rhinorrhea.     Mouth/Throat:     Mouth: Mucous membranes are moist.     Pharynx: Oropharynx is clear. No oropharyngeal exudate or posterior oropharyngeal erythema.  Eyes:     General: No scleral icterus.    Extraocular Movements: Extraocular movements intact.     Conjunctiva/sclera: Conjunctivae normal.     Pupils:  Pupils are equal, round, and reactive to light.  Cardiovascular:     Rate and Rhythm: Normal rate and regular rhythm.     Pulses: Normal pulses.     Heart sounds: Normal heart sounds. No murmur heard.    No friction rub. No gallop.  Pulmonary:     Effort: Pulmonary effort is normal.     Breath sounds: Normal breath sounds. No wheezing, rhonchi or rales.  Abdominal:     General: Abdomen is flat. Bowel sounds are normal. There is no distension.     Palpations: Abdomen is soft.     Tenderness: There is no abdominal tenderness.  Musculoskeletal:        General: No swelling. Normal range of motion.     Cervical back: Normal range of motion.     Right lower leg: No edema.     Left lower leg: No edema.  Lymphadenopathy:      Cervical: No cervical adenopathy.  Skin:    General: Skin is warm and dry.     Capillary Refill: Capillary refill takes less than 2 seconds.     Coloration: Skin is not jaundiced.  Neurological:     General: No focal deficit present.     Mental Status: She is alert and oriented to person, place, and time.  Psychiatric:        Mood and Affect: Mood normal.        Behavior: Behavior normal.   Last CBC Lab Results  Component Value Date   WBC 9.1 02/24/2023   HGB 15.1 02/24/2023   HCT 45.4 02/24/2023   MCV 85 02/24/2023   MCH 28.3 02/24/2023   RDW 11.6 (L) 02/24/2023   PLT 325 02/24/2023   Last metabolic panel Lab Results  Component Value Date   GLUCOSE 178 (H) 03/10/2023   NA 139 03/10/2023   K 4.3 03/10/2023   CL 103 03/10/2023   CO2 24 03/10/2023   BUN 19 03/10/2023   CREATININE 1.02 (H) 03/10/2023   EGFR 69 03/10/2023   CALCIUM 9.9 03/10/2023   PROT 6.4 02/24/2023   ALBUMIN 3.8 (L) 02/24/2023   LABGLOB 2.6 02/24/2023   AGRATIO 1.5 02/24/2023   BILITOT 0.4 02/24/2023   ALKPHOS 72 02/24/2023   AST 20 02/24/2023   ALT 27 02/24/2023   ANIONGAP 12 08/26/2022   Last lipids Lab Results  Component Value Date   CHOL 196 02/24/2023   HDL 49 02/24/2023   LDLCALC 129 (H) 02/24/2023   TRIG 101 02/24/2023   CHOLHDL 4.0 02/24/2023   Last hemoglobin A1c Lab Results  Component Value Date   HGBA1C 10.3 (H) 02/24/2023   Last thyroid functions Lab Results  Component Value Date   TSH 0.871 02/24/2023   Last vitamin D Lab Results  Component Value Date   VD25OH 7.0 (L) 02/24/2023   The 10-year ASCVD risk score (Arnett DK, et al., 2019) is: 1.7%    Assessment & Plan:   Problem List Items Addressed This Visit       Type 2 diabetes mellitus with diabetic neuropathy, without long-term current use of insulin (HCC)    A1c 10.3 in February.  Ozempic was increased to 1 mg weekly at her last appointment.  She is additionally prescribed metformin XR 500 mg daily.  She has  not experienced any adverse side effects with increasing Ozempic. -Increase Ozempic to 2 mg weekly after she completes four 1 mg injections -Repeat A1c at follow-up in 1 month  Hyperlipidemia associated with type 2 diabetes mellitus (HCC)    Lipid panel updated in February.  Total cholesterol 196 and LDL 129.  Her 10-year ASCVD risk score is 1.7%. -Start atorvastatin 20 mg daily for improved LDL control      Acute cystitis without hematuria    She continues to endorse dysuria and lower abdominal discomfort.  Symptoms resolved after she is treated with antibiotics, but quickly returned.  UA today is positive for leukocyte esterase.  She has most recently grown E. coli resistant to ampicillin and with intermediate resistance to Augmentin.  She has been treated with Macrobid x 2 recently as well as Cipro x 3 days. -Treat again with Macrobid x 5 days for UTI -Follow-up urine culture -Given her history of recurrent UTI despite treatment with appropriate antibiotics for an adequate duration, will refer to urology for further evaluation and management      Recurrent major depressive disorder, in partial remission (HCC)    Returning to care today for MDD follow-up.  BuSpar was discontinued at her last appointment and Cymbalta was increased to 90 mg daily.  She states that this has significantly improved her mood.  She is not interested in making any additional medication changes today. -Continue Cymbalta 90 g daily      Vitamin D deficiency    She remains on high-dose, weekly vitamin D supplementation.  Will plan to repeat her vitamin D level at follow-up in 1 month.      Colon cancer screening    She has previously been referred to gastroenterology for screening colonoscopy.  A letter was mailed to her home, however her address is changed.  We will update her contact information today.      Return in about 1 month (around 05/23/2023) for DM, repeat labs.   Billie Lade, MD

## 2023-04-27 LAB — URINE CULTURE

## 2023-04-28 DIAGNOSIS — Z419 Encounter for procedure for purposes other than remedying health state, unspecified: Secondary | ICD-10-CM | POA: Diagnosis not present

## 2023-04-29 ENCOUNTER — Encounter: Payer: Self-pay | Admitting: Internal Medicine

## 2023-04-30 ENCOUNTER — Other Ambulatory Visit: Payer: Self-pay

## 2023-04-30 ENCOUNTER — Encounter: Payer: Self-pay | Admitting: Internal Medicine

## 2023-04-30 DIAGNOSIS — R3989 Other symptoms and signs involving the genitourinary system: Secondary | ICD-10-CM

## 2023-04-30 DIAGNOSIS — E1140 Type 2 diabetes mellitus with diabetic neuropathy, unspecified: Secondary | ICD-10-CM

## 2023-04-30 MED ORDER — SEMAGLUTIDE (2 MG/DOSE) 8 MG/3ML ~~LOC~~ SOPN
2.0000 mg | PEN_INJECTOR | SUBCUTANEOUS | 0 refills | Status: DC
Start: 2023-04-30 — End: 2023-06-16

## 2023-04-30 MED ORDER — NITROFURANTOIN MONOHYD MACRO 100 MG PO CAPS
100.0000 mg | ORAL_CAPSULE | Freq: Two times a day (BID) | ORAL | 0 refills | Status: AC
Start: 2023-04-30 — End: 2023-05-05

## 2023-05-03 ENCOUNTER — Inpatient Hospital Stay (HOSPITAL_COMMUNITY): Admission: RE | Admit: 2023-05-03 | Payer: Medicaid Other | Source: Ambulatory Visit

## 2023-05-03 ENCOUNTER — Other Ambulatory Visit (HOSPITAL_COMMUNITY): Payer: Self-pay | Admitting: Internal Medicine

## 2023-05-03 DIAGNOSIS — Z1231 Encounter for screening mammogram for malignant neoplasm of breast: Secondary | ICD-10-CM

## 2023-05-06 ENCOUNTER — Encounter: Payer: Self-pay | Admitting: Emergency Medicine

## 2023-05-06 ENCOUNTER — Other Ambulatory Visit: Payer: Self-pay

## 2023-05-06 ENCOUNTER — Emergency Department: Payer: Medicaid Other

## 2023-05-06 ENCOUNTER — Emergency Department
Admission: EM | Admit: 2023-05-06 | Discharge: 2023-05-06 | Disposition: A | Discharge status: Home / Self Care | Payer: Medicaid Other | Attending: Emergency Medicine | Admitting: Emergency Medicine

## 2023-05-06 DIAGNOSIS — E119 Type 2 diabetes mellitus without complications: Secondary | ICD-10-CM | POA: Diagnosis not present

## 2023-05-06 DIAGNOSIS — R110 Nausea: Secondary | ICD-10-CM | POA: Diagnosis not present

## 2023-05-06 DIAGNOSIS — R109 Unspecified abdominal pain: Secondary | ICD-10-CM | POA: Diagnosis not present

## 2023-05-06 DIAGNOSIS — R1084 Generalized abdominal pain: Secondary | ICD-10-CM | POA: Diagnosis not present

## 2023-05-06 DIAGNOSIS — I10 Essential (primary) hypertension: Secondary | ICD-10-CM | POA: Insufficient documentation

## 2023-05-06 LAB — URINALYSIS, ROUTINE W REFLEX MICROSCOPIC
Bilirubin Urine: NEGATIVE
Glucose, UA: NEGATIVE mg/dL
Hgb urine dipstick: NEGATIVE
Ketones, ur: NEGATIVE mg/dL
Leukocytes,Ua: NEGATIVE
Nitrite: NEGATIVE
Protein, ur: NEGATIVE mg/dL
Specific Gravity, Urine: 1.014 (ref 1.005–1.030)
pH: 5 (ref 5.0–8.0)

## 2023-05-06 LAB — COMPREHENSIVE METABOLIC PANEL
ALT: 28 U/L (ref 0–44)
AST: 22 U/L (ref 15–41)
Albumin: 3.7 g/dL (ref 3.5–5.0)
Alkaline Phosphatase: 54 U/L (ref 38–126)
Anion gap: 7 (ref 5–15)
BUN: 14 mg/dL (ref 6–20)
CO2: 28 mmol/L (ref 22–32)
Calcium: 9.1 mg/dL (ref 8.9–10.3)
Chloride: 101 mmol/L (ref 98–111)
Creatinine, Ser: 0.99 mg/dL (ref 0.44–1.00)
GFR, Estimated: >60 mL/min (ref >60)
Glucose, Bld: 149 mg/dL — ABNORMAL HIGH (ref 70–99)
Potassium: 4.1 mmol/L (ref 3.5–5.1)
Sodium: 136 mmol/L (ref 135–145)
Total Bilirubin: 0.6 mg/dL (ref 0.3–1.2)
Total Protein: 7.1 g/dL (ref 6.5–8.1)

## 2023-05-06 LAB — CBC
HCT: 45.1 % (ref 36.0–46.0)
Hemoglobin: 14.8 g/dL (ref 12.0–15.0)
MCH: 28.8 pg (ref 26.0–34.0)
MCHC: 32.8 g/dL (ref 30.0–36.0)
MCV: 87.9 fL (ref 80.0–100.0)
Platelets: 332 10*3/uL (ref 150–400)
RBC: 5.13 MIL/uL — ABNORMAL HIGH (ref 3.87–5.11)
RDW: 11.9 % (ref 11.5–15.5)
WBC: 10.1 10*3/uL (ref 4.0–10.5)
nRBC: 0 % (ref 0.0–0.2)

## 2023-05-06 LAB — LIPASE, BLOOD: Lipase: 43 U/L (ref 11–51)

## 2023-05-06 MED ORDER — DICYCLOMINE HCL 20 MG PO TABS: 20.0000 mg | ORAL_TABLET | Freq: Four times a day (QID) | ORAL | 0 refills | Status: DC | PRN

## 2023-05-06 MED ORDER — ONDANSETRON HCL 4 MG/2ML IJ SOLN
4.0000 mg | Freq: Once | INTRAMUSCULAR | Status: AC
  Administered 2023-05-06: 4 mg via INTRAVENOUS
  Filled 2023-05-06: qty 2

## 2023-05-06 MED ORDER — SODIUM CHLORIDE 0.9 % IV BOLUS
1000.0000 mL | Freq: Once | INTRAVENOUS | Status: AC
  Administered 2023-05-06: 1000 mL via INTRAVENOUS

## 2023-05-06 MED ORDER — MORPHINE SULFATE (PF) 4 MG/ML IV SOLN
4.0000 mg | Freq: Once | INTRAVENOUS | Status: AC
  Administered 2023-05-06: 4 mg via INTRAVENOUS
  Filled 2023-05-06: qty 1

## 2023-05-06 MED ORDER — IOHEXOL 300 MG/ML  SOLN
100.0000 mL | Freq: Once | INTRAMUSCULAR | Status: AC | PRN
  Administered 2023-05-06: 100 mL via INTRAVENOUS

## 2023-05-06 NOTE — Discharge Instructions (Addendum)
You were seen in the emergency room today for evaluation of your abdominal pain.  Fortunately your lab work and CT scan were overall reassuring.  Please keep your scheduled follow-up with urology for your urine symptoms and follow-up with your primary care doctor for further evaluation of your pain.  You can take Tylenol and ibuprofen as needed unless there is another reason you should not take these.  I also sent a prescription for an anticramping medicine to your pharmacy that you can take as needed.  Please discuss with your primary care doctor if they think that your Ozempic could be contributing to your symptoms.  Return to the ER for new or worsening symptoms.

## 2023-05-06 NOTE — ED Provider Notes (Signed)
[facility] Provider Note    Event Date/Time   First MD Initiated Contact with Patient 05/06/23 1634     (approximate)   History   Abdominal Pain   HPI  Shakayla Derisha Oliveri is a 47 y.o. female with history of hypertension, diabetes presenting to the emergency department for evaluation of abdominal pain.  Reports she has been having ongoing dysuria and has tried multiple outpatient regimens, awaiting follow-up with urology for further evaluation.  However, more recently she has developed generalized abdominal cramping over the past 2 days.  Reports nausea without vomiting.  Has been taking his since March.  No fevers.     Physical Exam   Triage Vital Signs: ED Triage Vitals  Enc Vitals Group     BP 05/06/23 1423 110/76     Pulse Rate 05/06/23 1421 89     Resp 05/06/23 1421 18     Temp 05/06/23 1421 98.1 F (36.7 C)     Temp Source 05/06/23 1421 Oral     SpO2 05/06/23 1421 99 %     Weight --      Height --      Head Circumference --      Peak Flow --      Pain Score 05/06/23 1423 9     Pain Loc --      Pain Edu? --      Excl. in GC? --     Most recent vital signs: Vitals:   05/06/23 1421 05/06/23 1423  BP:  110/76  Pulse: 89   Resp: 18   Temp: 98.1 F (36.7 C)   SpO2: 99%      General: Awake, interactive  CV:  Regular rate, good peripheral perfusion.  Resp:  Lungs clear, unlabored respirations.  Abd:  Soft, nondistended, diffuse tenderness to palpation without rebound or guarding Neuro:  Symmetric facial movement, fluid speech   ED Results / Procedures / Treatments   Labs (all labs ordered are listed, but only abnormal results are displayed) Labs Reviewed  COMPREHENSIVE METABOLIC PANEL - Abnormal; Notable for the following components:      Result Value   Glucose, Bld 149 (*)    All other components within normal limits  CBC - Abnormal; Notable for the following components:   RBC 5.13 (*)    All other components within  normal limits  URINALYSIS, ROUTINE W REFLEX MICROSCOPIC - Abnormal; Notable for the following components:   Color, Urine YELLOW (*)    APPearance HAZY (*)    All other components within normal limits  LIPASE, BLOOD   RADIOLOGY Imaging independently reviewed and interpreted by myself demonstrates:  CT A/P without evidence of acute intra-abdominal pathology. Agree with radiology interpretation.  PROCEDURES:  Critical Care performed: No  Procedures   MEDICATIONS ORDERED IN ED: Medications  sodium chloride 0.9 % bolus 1,000 mL (1,000 mLs Intravenous New Bag/Given 05/06/23 1714)  ondansetron (ZOFRAN) injection 4 mg (4 mg Intravenous Given 05/06/23 1714)  morphine (PF) 4 MG/ML injection 4 mg (4 mg Intravenous Given 05/06/23 1715)  iohexol (OMNIPAQUE) 300 MG/ML solution 100 mL (100 mLs Intravenous Contrast Given 05/06/23 1725)     IMPRESSION / MDM / ASSESSMENT AND PLAN / ED COURSE  I reviewed the triage vital signs and the nursing notes.  Differential diagnosis includes, but is not limited to, appendicitis, colitis, diverticulitis, biliary pathology, pancreatitis  Patient's presentation is most consistent with acute presentation with potential threat to life or bodily function.  47 year old  female presenting with abdominal cramping.  Lab work overall reassuring.  Given diffuse tenderness CT was ordered and patient was treated symptomatically with IV fluids, morphine, Zofran.  CT without acute findings.  Has reportedly had ongoing UTI, but urine actually overall reassuring here.  Did discuss the results of the patient's workup.  On reevaluation, did express some concerns about fully emptying her bladder.  Bladder scan fortunately under 10 cc. She is comfortable with discharge home.  Will DC with prescription for Bentyl to see if this helps with her cramping sensation.  Strict return precautions provided.  Patient discharged in stable condition.      FINAL CLINICAL IMPRESSION(S) / ED DIAGNOSES    Final diagnoses:  Generalized abdominal pain     Rx / DC Orders   ED Discharge Orders          Ordered    dicyclomine (BENTYL) 20 MG tablet  Every 6 hours PRN        05/06/23 1906             Note:  This document was prepared using Dragon voice recognition software and may include unintentional dictation errors.   Trinna Post, MD 05/06/23 [phone]

## 2023-05-06 NOTE — ED Triage Notes (Signed)
Patient to ED via POV for lower back pain that wraps around into back. Hurting on both sides of stomach x2 days. Also having nausea. States she has had a UTI since March- suppose to be following up with urology. Currently on antibiotic. Also taking ozempic.

## 2023-05-13 ENCOUNTER — Other Ambulatory Visit: Payer: Self-pay | Admitting: Internal Medicine

## 2023-05-13 DIAGNOSIS — I10 Essential (primary) hypertension: Secondary | ICD-10-CM

## 2023-05-13 MED ORDER — OLMESARTAN MEDOXOMIL 20 MG PO TABS
20.0000 mg | ORAL_TABLET | Freq: Every day | ORAL | 0 refills | Status: DC
Start: 2023-05-13 — End: 2023-08-14

## 2023-05-14 ENCOUNTER — Encounter: Payer: Self-pay | Admitting: Internal Medicine

## 2023-05-14 ENCOUNTER — Telehealth: Payer: Medicaid Other | Admitting: Family Medicine

## 2023-05-14 DIAGNOSIS — K047 Periapical abscess without sinus: Secondary | ICD-10-CM

## 2023-05-14 MED ORDER — CLINDAMYCIN HCL 300 MG PO CAPS: 300.0000 mg | ORAL_CAPSULE | Freq: Three times a day (TID) | ORAL | 0 refills | Status: AC

## 2023-05-14 NOTE — Progress Notes (Signed)
Virtual Visit Consent   Nicole Alvarado, you are scheduled for a virtual visit with a [facility] provider today. Just as with appointments in the office, your consent must be obtained to participate. Your consent will be active for this visit and any virtual visit you may have with one of our providers in the next 365 days. If you have a MyChart account, a copy of this consent can be sent to you electronically.  As this is a virtual visit, video technology does not allow for your provider to perform a traditional examination. This may limit your provider's ability to fully assess your condition. If your provider identifies any concerns that need to be evaluated in person or the need to arrange testing (such as labs, EKG, etc.), we will make arrangements to do so. Although advances in technology are sophisticated, we cannot ensure that it will always work on either your end or our end. If the connection with a video visit is poor, the visit may have to be switched to a telephone visit. With either a video or telephone visit, we are not always able to ensure that we have a secure connection.  By engaging in this virtual visit, you consent to the provision of healthcare and authorize for your insurance to be billed (if applicable) for the services provided during this visit. Depending on your insurance coverage, you may receive a charge related to this service.  I need to obtain your verbal consent now. Are you willing to proceed with your visit today? Nicole Alvarado has provided verbal consent on 05/14/2023 for a virtual visit (video or telephone). Georgana Curio, FNP  Date: 05/14/2023 3:33 PM  Virtual Visit via Video Note   I, Georgana Curio, connected with  Nicole Alvarado  ([id number], 02-11-1976) on 05/14/23 at  3:30 PM EDT by a video-enabled telemedicine application and verified that I am speaking with the correct person using two identifiers.  Location: Patient: Virtual Visit Location  Patient: Home Provider: Virtual Visit Location Provider: Home Office   I discussed the limitations of evaluation and management by telemedicine and the availability of in person appointments. The patient expressed understanding and agreed to proceed.    History of Present Illness: Nicole Alvarado is a 47 y.o. who identifies as a female who was assigned female at birth, and is being seen today for abscess right upper tooth with mild facial swelling and pain. No fever. Marland Kitchen  HPI: HPI  Problems:  Patient Active Problem List   Diagnosis Date Noted   Hyperlipidemia associated with type 2 diabetes mellitus (HCC) 04/23/2023   Vitamin D deficiency 03/23/2023   Colon cancer screening 03/23/2023   HTN (hypertension) 03/11/2023   Acute cystitis without hematuria 03/11/2023   Morbid obesity (HCC) 02/24/2023   Recurrent major depressive disorder, in partial remission (HCC) 02/23/2023   Type 2 diabetes mellitus with diabetic neuropathy, without long-term current use of insulin (HCC) 02/23/2023   Encounter for general adult medical examination with abnormal findings 02/23/2023   Insomnia 02/23/2023    Allergies:  Allergies  Allergen Reactions   Penicillins Anaphylaxis   Bactrim [Sulfamethoxazole-Trimethoprim] Hives   Sulfa Antibiotics Hives   Keflex [Cephalexin] Rash   Medications:  Current Outpatient Medications:    clindamycin (CLEOCIN) 300 MG capsule, Take 1 capsule (300 mg total) by mouth 3 (three) times daily for 7 days., Disp: 21 capsule, Rfl: 0   Accu-Chek Softclix Lancets lancets, Use as instructed, Disp: 100 each, Rfl: 12   albuterol (VENTOLIN  HFA) 108 (90 Base) MCG/ACT inhaler, Inhale 2 puffs into the lungs every 6 (six) hours as needed for wheezing or shortness of breath., Disp: 8 g, Rfl: 0   atorvastatin (LIPITOR) 20 MG tablet, Take 1 tablet (20 mg total) by mouth daily., Disp: 90 tablet, Rfl: 3   blood glucose meter kit and supplies KIT, Dispense based on patient and insurance  preference. Use up to four times daily as directed., Disp: 1 each, Rfl: 0   Blood Glucose Monitoring Suppl (ACCU-CHEK GUIDE ME) w/Device KIT, 1 Device by Does not apply route 4 (four) times daily., Disp: 1 kit, Rfl: 0   Cholecalciferol 1.25 MG (50000 UT) capsule, Take 1 capsule (50,000 Units total) by mouth once a week., Disp: 8 capsule, Rfl: 0   dicyclomine (BENTYL) 20 MG tablet, Take 1 tablet (20 mg total) by mouth every 6 (six) hours as needed for up to 7 days for spasms., Disp: 25 tablet, Rfl: 0   DULoxetine (CYMBALTA) 30 MG capsule, Take 1 capsule (30 mg total) by mouth daily., Disp: 30 capsule, Rfl: 2   DULoxetine (CYMBALTA) 60 MG capsule, Take 1 capsule (60 mg total) by mouth daily., Disp: 30 capsule, Rfl: 2   gabapentin (NEURONTIN) 100 MG capsule, Take 1 capsule (100 mg total) by mouth 3 (three) times daily. Three times daily, Disp: 270 capsule, Rfl: 0   glucose blood (ACCU-CHEK GUIDE) test strip, Use as instructed, Disp: 100 each, Rfl: 12   metFORMIN (GLUCOPHAGE-XR) 500 MG 24 hr tablet, Take 1 tablet (500 mg total) by mouth daily with breakfast., Disp: 90 tablet, Rfl: 3   olmesartan (BENICAR) 20 MG tablet, Take 1 tablet (20 mg total) by mouth daily., Disp: 90 tablet, Rfl: 0   Semaglutide, 1 MG/DOSE, 4 MG/3ML SOPN, Inject 1 mg as directed once a week., Disp: 3 mL, Rfl: 0   Semaglutide, 2 MG/DOSE, 8 MG/3ML SOPN, Inject 2 mg as directed once a week., Disp: 3 mL, Rfl: 0  Observations/Objective: Patient is well-developed, well-nourished in no acute distress.  Resting comfortably  at home.  Head is normocephalic, atraumatic.  No labored breathing.  Speech is clear and coherent with logical content.  Patient is alert and oriented at baseline.    Assessment and Plan: 1. Dental abscess  Warm salt water rinses, ibuprofen as directed, UC if sx worsen. Follow up with dentist next week.   Follow Up Instructions: I discussed the assessment and treatment plan with the patient. The patient was  provided an opportunity to ask questions and all were answered. The patient agreed with the plan and demonstrated an understanding of the instructions.  A copy of instructions were sent to the patient via MyChart unless otherwise noted below.     The patient was advised to call back or seek an in-person evaluation if the symptoms worsen or if the condition fails to improve as anticipated.  Time:  I spent 10 minutes with the patient via telehealth technology discussing the above problems/concerns.    Georgana Curio, FNP

## 2023-05-14 NOTE — Patient Instructions (Signed)
Dental Abscess  A dental abscess is an infection around a tooth that may involve pain, swelling, and a collection of pus, as well as other symptoms. Treatment is important to help with symptoms and to prevent the infection from spreading. The general types of dental abscesses are: Pulpal abscess. This abscess may form from the inner part of the tooth (pulp). Periodontal abscess. This abscess may form from the gum. What are the causes? This condition is caused by a bacterial infection in or around the tooth. It may result from: Severe tooth decay (cavities). Trauma to the tooth, such as a broken or chipped tooth. What increases the risk? This condition is more likely to develop in males. It is also more likely to develop in people who: Have cavities. Have severe gum disease. Eat sugary snacks between meals. Use tobacco products. Have diabetes. Have a weakened disease-fighting system (immune system). Do not brush and care for their teeth regularly. What are the signs or symptoms? Mild symptoms of this condition include: Tenderness. Bad breath. Fever. A bitter taste in the mouth. Pain in and around the infected tooth. Moderate symptoms of this condition include: Swollen neck glands. Chills. Pus drainage. Swelling and redness around the infected tooth, in the mouth, or in the face. Severe pain in and around the infected tooth. Severe symptoms of this condition include: Difficulty swallowing. Difficulty opening the mouth. Nausea. Vomiting. How is this diagnosed? This condition is diagnosed based on: Your symptoms and your medical and dental history. An examination of the infected tooth. During the exam, your dental care provider may tap on the infected tooth. You may also need to have X-rays taken of the affected area. How is this treated? This condition is treated by getting rid of the infection. This may be done with: Antibiotic medicines. These may be used in certain  situations. Antibacterial mouth rinse. Incision and drainage. This procedure is done by making an incision in the abscess to drain out the pus. Removing pus is the first priority in treating an abscess. A root canal. This may be performed to save the tooth. Your dental care provider accesses the visible part of your tooth (crown) with a drill and removes any infected pulp. Then the space is filled and sealed off. Tooth extraction. The tooth is pulled out if it cannot be saved by other treatment. You may also receive treatment for pain, such as: Acetaminophen or NSAIDs. Gels that contain a numbing medicine. An injection to block the pain near your nerve. Follow these instructions at home: Medicines Take over-the-counter and prescription medicines only as told by your dental care provider. If you were prescribed an antibiotic, take it as told by your dental care provider. Do not stop taking the antibiotic even if you start to feel better. If you were prescribed a gel that contains a numbing medicine, use it exactly as told in the directions. Do not use these gels for children who are younger than 2 years of age. Use an antibacterial mouth rinse as told by your dental care provider. General instructions  Gargle with a mixture of salt and water 3-4 times a day or as needed. To make salt water, completely dissolve -1 tsp (3-6 g) of salt in 1 cup (237 mL) of warm water. Eat a soft diet while your abscess is healing. Drink enough fluid to keep your urine pale yellow. Do not apply heat to the outside of your mouth. Do not use any products that contain nicotine or tobacco. These   products include cigarettes, chewing tobacco, and vaping devices, such as e-cigarettes. If you need help quitting, ask your dental care provider. Keep all follow-up visits. This is important. How is this prevented?  Excellent dental home care, which includes brushing your teeth every morning and night with fluoride  toothpaste. Floss one time each day. Get regularly scheduled dental cleanings. Consider having a dental sealant applied on teeth that have deep grooves to prevent cavities. Drink fluoridated water regularly. This includes most tap water. Check the label on bottled water to see if it contains fluoride. Reduce or eliminate sugary drinks. Eat healthy meals and snacks. Wear a mouth guard or face shield to protect your teeth while playing sports. Contact a health care provider if: Your pain is worse and is not helped by medicine. You have swelling. You see pus around the tooth. You have a fever or chills. Get help right away if: Your symptoms suddenly get worse. You have a very bad headache. You have problems breathing or swallowing. You have trouble opening your mouth. You have swelling in your neck or around your eye. These symptoms may represent a serious problem that is an emergency. Do not wait to see if the symptoms will go away. Get medical help right away. Call your local emergency services (911 in the [place] Do not drive yourself to the hospital. Summary A dental abscess is a collection of pus in or around a tooth that results from an infection. A dental abscess may result from severe tooth decay, trauma to the tooth, or severe gum disease around a tooth. Symptoms include severe pain, swelling, redness, and drainage of pus in and around the infected tooth. The first priority in treating a dental abscess is to drain out the pus. Treatment may also involve removing damage inside the tooth (root canal) or extracting the tooth. This information is not intended to replace advice given to you by your health care provider. Make sure you discuss any questions you have with your health care provider. Document Revised: 02/20/2021 Document Reviewed: 02/20/2021 [employer] Patient Education  2023 [employer]  

## 2023-05-18 ENCOUNTER — Other Ambulatory Visit: Payer: Self-pay

## 2023-05-18 MED ORDER — SEMAGLUTIDE (1 MG/DOSE) 4 MG/3ML ~~LOC~~ SOPN: 1.0000 mg | PEN_INJECTOR | SUBCUTANEOUS | 0 refills | Status: DC

## 2023-05-28 ENCOUNTER — Encounter: Payer: Self-pay | Admitting: Internal Medicine

## 2023-05-28 ENCOUNTER — Ambulatory Visit: Payer: Medicaid Other | Admitting: Internal Medicine

## 2023-05-29 DIAGNOSIS — Z419 Encounter for procedure for purposes other than remedying health state, unspecified: Secondary | ICD-10-CM | POA: Diagnosis not present

## 2023-06-01 ENCOUNTER — Encounter: Payer: Self-pay | Admitting: Internal Medicine

## 2023-06-02 ENCOUNTER — Ambulatory Visit: Payer: Medicaid Other | Admitting: Internal Medicine

## 2023-06-02 NOTE — Progress Notes (Deleted)
History of Present Illness: [name] is a 47 y.o. female who presents today at [facility] as a new patient. All available relevant medical records have been reviewed.  - Past medical history includes HTN, HLD, T2DM with neuropathy, obesity, anxiety. ***Current ***Former smoker.  She reports chief complaint of recurrent UTls.  Per PCP note on 04/23/2023: "She was last evaluated by me on 3/26.Marland KitchenMarland KitchenIn the interim she was evaluated through telehealth for over UTI symptoms and was again treated with Macrobid. Despite adequate treatment with Macrobid, her symptoms persisted and she has more recently been treated with Cipro x 3 days. There have otherwise been no acute interval events. Today [name] continues to endorse symptoms of dysuria and lower abdominal discomfort. She states that her symptoms resolved when she is taking antibiotics but quickly returned after she finishes them. She endorses a history of recurrent UTI, noting that she had to take Macrobid for the duration of her 4 pregnancies. She has never been evaluated by urology."   Urine culture results in past 12 months: - 07/30/2022: Negative. Treated with Macrobid. - 03/10/2023: Positive for E. Coli. Treated with Macrobid. - 04/06/2023: No culture. Treated with Macrobid. - 04/15/2023: No culture. Treated with Cipro. - 04/23/2023: Negative. Treated with Macrobid.  - 04/30/2023: No culture. Treated with Macrobid.  She reports *** UTls in past 12 months. When present, UTI symptoms include ***increased urinary urgency, frequency, dysuria, ***.  - She {Actions; denies-reports:120008} bladder pain.  - ***Bladder pain is described as ***/10, ***, ***constant/ intermittent***. - She {Actions; denies-reports:120008} pain with bladder filling. - She {Actions; denies-reports:120008} relief with voiding. - She {Actions; denies-reports:120008} symptom exacerbation with certain foods/beverages including ***. - She  {Actions; denies-reports:120008} being sexually active. She *** dyspareunia. - She {Actions; denies-reports:120008} history of kidney stones. - She {Actions; denies-reports:120008} history of pyelonephritis. - She {Actions; denies-reports:120008} prior GU / GYN surgical procedures including ***  She reports *** urinary stream. She {Actions; denies-reports:120008} urgency. She {Actions; denies-reports:120008} ***increased urinary frequency (*** times per day and *** times at night). She {Actions; denies-reports:120008} dysuria.  She {Actions; denies-reports:120008} gross hematuria. She {Actions; denies-reports:120008} the need to strain to void.  She {Actions; denies-reports:120008} sensations of incomplete emptying.  She {Actions; denies-reports:120008} urge incontinence. She {Actions; denies-reports:120008} stress incontinence with ***cough/***laugh/***sneeze/***heavy lifting /***exercise. She reports the ***SUI / ***UUI is predominant.  She leaks *** times per ***. Wears *** ***pads/ ***diapers per day. She reports urinary incontinence {ACTION; IS/IS [id number]} significantly bothersome.   She {Actions; denies-reports:120008} flank pain. She {Actions; denies-reports:120008} abdominal pain. She {Actions; denies-reports:120008} fevers. She {Actions; denies-reports:120008} nausea/ vomiting.   Fall Screening: Do you usually have a device to assist in your mobility? {yes/no:20286} ***cane / ***walker / ***wheelchair  Medications: Current Outpatient Medications  Medication Sig Dispense Refill   Accu-Chek Softclix Lancets lancets Use as instructed 100 each 12   albuterol (VENTOLIN HFA) 108 (90 Base) MCG/ACT inhaler Inhale 2 puffs into the lungs every 6 (six) hours as needed for wheezing or shortness of breath. 8 g 0   atorvastatin (LIPITOR) 20 MG tablet Take 1 tablet (20 mg total) by mouth daily. 90 tablet 3   blood glucose meter kit and supplies KIT Dispense based on patient and  insurance preference. Use up to four times daily as directed. 1 each 0   Blood Glucose Monitoring Suppl (ACCU-CHEK GUIDE ME) w/Device KIT 1 Device by Does not apply route 4 (four) times daily. 1 kit 0   Cholecalciferol 1.25 MG (50000  UT) capsule Take 1 capsule (50,000 Units total) by mouth once a week. 8 capsule 0   dicyclomine (BENTYL) 20 MG tablet Take 1 tablet (20 mg total) by mouth every 6 (six) hours as needed for up to 7 days for spasms. 25 tablet 0   DULoxetine (CYMBALTA) 30 MG capsule Take 1 capsule (30 mg total) by mouth daily. 30 capsule 2   DULoxetine (CYMBALTA) 60 MG capsule Take 1 capsule (60 mg total) by mouth daily. 30 capsule 2   gabapentin (NEURONTIN) 100 MG capsule Take 1 capsule (100 mg total) by mouth 3 (three) times daily. Three times daily 270 capsule 0   glucose blood (ACCU-CHEK GUIDE) test strip Use as instructed 100 each 12   metFORMIN (GLUCOPHAGE-XR) 500 MG 24 hr tablet Take 1 tablet (500 mg total) by mouth daily with breakfast. 90 tablet 3   olmesartan (BENICAR) 20 MG tablet Take 1 tablet (20 mg total) by mouth daily. 90 tablet 0   Semaglutide, 1 MG/DOSE, 4 MG/3ML SOPN Inject 1 mg as directed once a week. 3 mL 0   Semaglutide, 1 MG/DOSE, 4 MG/3ML SOPN Inject 1 mg as directed once a week. 3 mL 0   Semaglutide, 2 MG/DOSE, 8 MG/3ML SOPN Inject 2 mg as directed once a week. 3 mL 0   No current facility-administered medications for this visit.    Allergies: Allergies  Allergen Reactions   Penicillins Anaphylaxis   Bactrim [Sulfamethoxazole-Trimethoprim] Hives   Sulfa Antibiotics Hives   Keflex [Cephalexin] Rash    Past Medical History:  Diagnosis Date   Anxiety    Diabetes mellitus without complication (HCC)    Hypertension    Neuropathy    Past Surgical History:  Procedure Laterality Date   CESAREAN SECTION     5   TOTAL ABDOMINAL HYSTERECTOMY     Family History  Problem Relation Age of Onset   Heart attack Mother 62   Anxiety disorder Mother     Anxiety disorder Sister    Multiple sclerosis Sister    Social History   Socioeconomic History   Marital status: Married    Spouse name: Not on file   Number of children: Not on file   Years of education: Not on file   Highest education level: 10th grade  Occupational History   Not on file  Tobacco Use   Smoking status: Never    Passive exposure: Never   Smokeless tobacco: Never  Vaping Use   Vaping Use: Never used  Substance and Sexual Activity   Alcohol use: Never   Drug use: Never   Sexual activity: Not on file  Other Topics Concern   Not on file  Social History Narrative   Not on file   Social Determinants of Health   Financial Resource Strain: Low Risk  (04/23/2023)   Overall Financial Resource Strain (CARDIA)    Difficulty of Paying Living Expenses: Not very hard  Food Insecurity: Food Insecurity Present (04/23/2023)   Hunger Vital Sign    Worried About Running Out of Food in the Last Year: Sometimes true    Ran Out of Food in the Last Year: Sometimes true  Transportation Needs: No Transportation Needs (04/23/2023)   PRAPARE - Administrator, Civil Service (Medical): No    Lack of Transportation (Non-Medical): No  Physical Activity: Insufficiently Active (04/23/2023)   Exercise Vital Sign    Days of Exercise per Week: 1 day    Minutes of Exercise per Session: 20 min  Stress: No Stress Concern Present (04/23/2023)   [employer] of Occupational Health - Occupational Stress Questionnaire    Feeling of Stress : Only a little  Social Connections: Moderately Integrated (04/23/2023)   Social Connection and Isolation Panel [NHANES]    Frequency of Communication with Friends and Family: More than three times a week    Frequency of Social Gatherings with Friends and Family: Once a week    Attends Religious Services: 1 to 4 times per year    Active Member of [employer] or Organizations: No    Attends Engineer, structural: Not on file    Marital Status:  Married  Catering manager Violence: Not on file    SUBJECTIVE  Review of Systems Constitutional: Patient ***denies any unintentional weight loss or change in strength lntegumentary: Patient ***denies any rashes or pruritus Eyes: Patient denies ***dry eyes ENT: Patient ***denies dry mouth Cardiovascular: Patient ***denies chest pain or syncope Respiratory: Patient ***denies shortness of breath Gastrointestinal: Patient ***denies nausea, vomiting, constipation, or diarrhea Musculoskeletal: Patient ***denies muscle cramps or weakness Neurologic: Patient ***denies convulsions or seizures Psychiatric: Patient ***denies memory problems Allergic/Immunologic: Patient ***denies recent allergic reaction(s) Hematologic/Lymphatic: Patient denies bleeding tendencies Endocrine: Patient ***denies heat/cold intolerance  GU: As per HPI.  OBJECTIVE There were no vitals filed for this visit. There is no height or weight on file to calculate BMI.  Physical Examination  Constitutional: ***No obvious distress; patient is ***non-toxic appearing  Cardiovascular: ***No visible lower extremity edema.  Respiratory: The patient does ***not have audible wheezing/stridor; respirations do ***not appear labored  Gastrointestinal: Abdomen ***non-distended Musculoskeletal: ***Normal ROM of UEs  Skin: ***No obvious rashes/open sores  Neurologic: CN 2-12 grossly ***intact Psychiatric: Answered questions ***appropriately with ***normal affect  Hematologic/Lymphatic/Immunologic: ***No obvious bruises or sites of spontaneous bleeding  UA: {Desc; negative/positive:13464} *** WBC/hpf, *** RBC/hpf, bacteria (***) *** nitrites, *** leukocytes, *** blood PVR: *** ml  ASSESSMENT No diagnosis found.  After careful consideration of all available data and history and physical examination this patient was diagnosed with interstitial cystitis. We discussed in detail the etiology of this condition. We reviewed normal  bladder function and what is known and not known about IC/ PBS. We reviewed the need to treat the abnormal GAG layer, mast cell up-regulation, and neurogenic inflammation simultaneously in order to adequately treat all components of the IC symptom complex. We reviewed the benefits v. risks/burdens of the available treatment alternatives, the fact that no single treatment has been found effective for the majority of patients, and the fact that acceptable symptom control may require trials of multiple therapeutic options (including combination therapy) before it is achieved. We discussed the overlap of UTI versus IC symptoms and that we would always recommend obtaining a urine culture prior to antibiotic treatment in the future. We discussed antibiotic stewardship and goal to minimize risk for developing antibiotic resistance.  An individualized multimodal approach was developed utilizing recommendations from the AUA practice guideline on IC/BPS.  For irritative bladder/IC we reviewed treatment options including: IC diet to avoid irritative beverages and foods. Attempting to decrease stress and other exacerbating factors. Medication options including: Urinary analgesics (Pyridium or Uribel). We discussed importance of limiting Pyridum use to 3 days consecutively due to risk for methemoglobinemia and damage to bone health. Elmiron for treatment of GAG layer deficiencies. Side effects including headache, GI upset including diarrhea, and potential hair loss were discussed. The patient was advised that it may take up to six months for this therapy to affect the bladder.  Hydroxyzine or OTC antihistamines (preferably Zyrtec or Xyzal) to minimize histamine-mediated bladder inflammation. Singulair for mast cell/histamine stabilization. Medications such as Amitriptyline, Cymbalta, Trazodone, Gabapentin, or Lyrica to reduce neuropathic pain related to up-regulated nerve response in the bladder/pelvis. OTC  supplements (such as CystoProtek, Cysta-Q, Bladder Renew, Prelief, Freeze-dried aloe vera). Bladder instillations routinely or as needed (at home/ in-office). Cystoscopy with hydrodistention. We discussed the potential diagnostic and therapeutic benefits of procedure. The patient was told that there is approximately 60% chance of symptomatic improvement following bladder hydrodistention and that the response can be quite variable.  For high-tone pelvic floor dysfunction secondary to IC we reviewed treatment options including: Oral muscle relaxants (such as Baclofen, Flexeril, Zanaflex). Side effects including sedation were discussed. Vaginal or rectal Valium suppositories Referral to pelvic floor physical therapy  ***We discussed potential concerns regarding macular retinopathy / vision problems with Elmiron use. Although we are not aware of any substantial evidence of such a correlation at this time, we discussed the recommendation for patients who are currently taking or who have previously taken Elmiron to obtain routine eye exams, to discuss their Elmiron use with their eye doctor, and to notify us promptly of any concerns. We discussed that currently Elmiron continues to be approved by the FDA and AUA as a therapy option for IC. May consider discontinuation if visual symptoms arise or if pt is uncomfortable with continuing Elmiron. May consider alternatives including Elmiron bladder instillations and OTC options Cystoprotek and Cysta-Q.  We discussed possible contributing factors for IC flare ups such as: stress dietary triggers seasonal environmental allergens activities that put pressure on the pelvic area/ perineum (such as sexual intercourse, horse back riding, long car rides)  Patient was advised that if/when UTI-like symptoms occur they can call our office to speak with a nurse, who can place an order for urinalysis (with reflex to urine culture). They may then proceed to a [facility] to provide their urine sample. This is required for evaluation in order for their Urology provider to make informed treatment recommendation(s), which may or may not include an antibiotic prescription.  Ultimately they elected to***  Will plan for follow up in *** or sooner if needed. Pt verbalized understanding and agreement. All questions were answered.  PLAN Advised the following: *** ***No follow-ups on file.  No orders of the defined types were placed in this encounter.   It has been explained that the patient is to follow regularly with their PCP in addition to all other providers involved in their care and to follow instructions provided by these respective offices. Patient advised to contact urology clinic if any urologic-pertaining questions, concerns, new symptoms or problems arise in the interim period.  There are no Patient Instructions on file for this visit.  Electronically signed by:  Donnita Falls, MSN, FNP-C, CUNP 06/02/2023 2:09 PM

## 2023-06-03 ENCOUNTER — Ambulatory Visit: Payer: Medicaid Other | Admitting: Urology

## 2023-06-03 DIAGNOSIS — R1030 Lower abdominal pain, unspecified: Secondary | ICD-10-CM

## 2023-06-03 DIAGNOSIS — R300 Dysuria: Secondary | ICD-10-CM

## 2023-06-07 ENCOUNTER — Telehealth: Payer: Self-pay

## 2023-06-07 ENCOUNTER — Other Ambulatory Visit: Payer: Self-pay

## 2023-06-07 DIAGNOSIS — F3341 Major depressive disorder, recurrent, in partial remission: Secondary | ICD-10-CM

## 2023-06-07 MED ORDER — DULOXETINE HCL 30 MG PO CPEP
30.0000 mg | ORAL_CAPSULE | Freq: Every day | ORAL | 2 refills | Status: DC
Start: 2023-06-07 — End: 2023-09-06

## 2023-06-07 NOTE — Telephone Encounter (Signed)
Patient requesting refill of Cymbalta 30 mg to [facility] in [place].

## 2023-06-07 NOTE — Telephone Encounter (Signed)
Refills sent to pharmacy in [place]

## 2023-06-15 ENCOUNTER — Other Ambulatory Visit: Payer: Self-pay | Admitting: Internal Medicine

## 2023-06-16 ENCOUNTER — Other Ambulatory Visit: Payer: Self-pay | Admitting: Internal Medicine

## 2023-06-16 DIAGNOSIS — E1140 Type 2 diabetes mellitus with diabetic neuropathy, unspecified: Secondary | ICD-10-CM

## 2023-06-16 MED ORDER — SEMAGLUTIDE (2 MG/DOSE) 8 MG/3ML ~~LOC~~ SOPN
2.0000 mg | PEN_INJECTOR | SUBCUTANEOUS | 0 refills | Status: DC
Start: 2023-06-16 — End: 2023-07-13

## 2023-06-18 ENCOUNTER — Ambulatory Visit: Payer: Medicaid Other | Admitting: Internal Medicine

## 2023-06-22 NOTE — Progress Notes (Deleted)
History of Present Illness: [name] is a 47 y.o. female who presents today as a new patient at [facility]. All available relevant medical records have been reviewed.    She reports chief complaint of recurrent LUTS / UTI symptoms. She reports *** UTl's in the last year. When present, UTI symptoms include ***dysuria, ***increased urinary urgency, ***frequency, ***. She reports that UTI symptoms do ***not seem to correlate with intercourse.  Recent history:  - 03/10/2023: Urine culture positive for 10-25k E. Coli. Treated with Macrobid. - 04/06/2023: Telemedicine visit for UTI symptoms. Treated with Macrobid. - 04/14/2023: PCP called in Cipro 250 mg 2x/day x3 days for reported UTI symptoms. - 04/23/2023: Seen by PCP. Reported UTI symptoms including dysuria and lower abdominal discomfort. Per note: "She states that her symptoms resolved when she is taking antibiotics but quickly returned after she finishes them. She endorses a history of recurrent UTI, noting that she had to take Macrobid for the duration of her 4 pregnancies. She has never been evaluated by urology." Treated with Macrobid; urine culture came back negative. - 04/30/2023: PCP called in Macrobid for reported UTI symptoms. - 05/06/2023: Seen in ER for abdominal cramping and persistent dysuria. CBC & CMP unremarkable. UA negative. PVR <10 ml. CT abdomen/pelvis w/ contrast negative for GU stones, masses, or hydronephrosis. Was prescribed Bentyl.  Urine culture results in past 12 months: - 07/30/2022: Negative - 03/10/2023: Positive for 10-25k E. Coli - 04/23/2023: Negative   Today: She {Actions; denies-reports:120008} acute UTI symptoms today.  She {Actions; denies-reports:120008} urinary urgency, frequency, dysuria, gross hematuria, hesitancy, straining to void, or sensations of incomplete emptying. She reports voiding *** times per day and *** times at night. She {Actions; denies-reports:120008} pushing  on a bulge in order to empty her bladder.  She {Actions; denies-reports:120008} urge incontinence. She {Actions; denies-reports:120008} stress incontinence with ***cough/***laugh/***sneeze/***heavy lifting/***exercise. She {Actions; denies-reports:120008} enuresis. She leaks *** times per ***. Wears *** ***pads / ***diapers per day. She states the ***SUI / ***UUI is predominant. This has been going on for {NUMBERS 1-12:18279} {days/wks/mos/yrs:310907} and {ACTION; IS/IS ZOX:09604540} significantly bothersome. In terms of treatment, She has tried ***.  She {Actions; denies-reports:120008} caffeine intake.  She {Actions; denies-reports:120008} history of pyelonephritis.  She {Actions; denies-reports:120008} history of kidney stones.  Vaginal / prolapse Symptoms: She {Actions; denies-reports:120008} vaginal bulge sensation.  She {Actions; denies-reports:120008} seeing a vaginal bulge. This bulge is bothersome and is the size of ***. It was first noticed ***.  She {Actions; denies-reports:120008} vaginal pain, bleeding, or discharge.  She {Actions; denies-reports:120008} use of topical vaginal estrogen cream.  Bowel Symptoms: She has a continent bowel movement *** time(s) per ***. Typical Bristol stool scale of continent bowel episodes is Type ***. She {Actions; denies-reports:120008} Fl episodes. Fecal incontinence started *** and occurs *** times per week. Typical Bristol stool scale of incontinent bowel episodes is Type ***. She {Actions; denies-reports:120008} straining and {Actions; denies-reports:120008} splinting to defecate. She {Actions; denies-reports:120008} rectal bleeding. She {Actions; denies-reports:120008} feeling like she fully empties her rectum with defecation. She {Actions; denies-reports:120008} urgency with defecation. She {Actions; denies-reports:120008} difficulty wiping clean. She {Actions; denies-reports:120008} taking laxatives, stool softeners, or fiber supplements.  Last colonoscopy was***.  Past OB/GYN History: OB History   No obstetric history on file.    She {Actions; denies-reports:120008} being sexually active.  She {Actions; denies-reports:120008} dyspareunia. ***Dyspareunia is located ***at the introitus / ***deep inside the vagina. She has *** child(ren) who were delivered ***vaginally ***via c-section. She {Actions; denies-reports:120008} significant  tearing with that ***delivery / those ***deliveries. She is {DESC; PRE/POST:32304} menopausal.  Last pap smear was ***. She {Actions; denies-reports:120008} history of *** hysterectomy.  Fall Screening: Do you usually have a device to assist in your mobility? {yes/no:20286} ***cane / ***walker / ***wheelchair   Medications: Current Outpatient Medications  Medication Sig Dispense Refill   Accu-Chek Softclix Lancets lancets Use as instructed 100 each 12   albuterol (VENTOLIN HFA) 108 (90 Base) MCG/ACT inhaler Inhale 2 puffs into the lungs every 6 (six) hours as needed for wheezing or shortness of breath. 8 g 0   atorvastatin (LIPITOR) 20 MG tablet Take 1 tablet (20 mg total) by mouth daily. 90 tablet 3   blood glucose meter kit and supplies KIT Dispense based on patient and insurance preference. Use up to four times daily as directed. 1 each 0   Blood Glucose Monitoring Suppl (ACCU-CHEK GUIDE ME) w/Device KIT 1 Device by Does not apply route 4 (four) times daily. 1 kit 0   Cholecalciferol 1.25 MG (50000 UT) capsule Take 1 capsule (50,000 Units total) by mouth once a week. 8 capsule 0   dicyclomine (BENTYL) 20 MG tablet Take 1 tablet (20 mg total) by mouth every 6 (six) hours as needed for up to 7 days for spasms. 25 tablet 0   DULoxetine (CYMBALTA) 30 MG capsule Take 1 capsule (30 mg total) by mouth daily. 30 capsule 2   DULoxetine (CYMBALTA) 60 MG capsule Take 1 capsule (60 mg total) by mouth daily. 30 capsule 2   gabapentin (NEURONTIN) 100 MG capsule Take 1 capsule (100 mg total) by mouth 3  (three) times daily. Three times daily 270 capsule 0   glucose blood (ACCU-CHEK GUIDE) test strip Use as instructed 100 each 12   metFORMIN (GLUCOPHAGE-XR) 500 MG 24 hr tablet Take 1 tablet (500 mg total) by mouth daily with breakfast. 90 tablet 3   olmesartan (BENICAR) 20 MG tablet Take 1 tablet (20 mg total) by mouth daily. 90 tablet 0   Semaglutide, 1 MG/DOSE, 4 MG/3ML SOPN Inject 1 mg as directed once a week. 3 mL 0   Semaglutide, 1 MG/DOSE, 4 MG/3ML SOPN Inject 1 mg as directed once a week. 3 mL 0   Semaglutide, 2 MG/DOSE, 8 MG/3ML SOPN Inject 2 mg as directed once a week. 3 mL 0   No current facility-administered medications for this visit.    Allergies: Allergies  Allergen Reactions   Penicillins Anaphylaxis   Bactrim [Sulfamethoxazole-Trimethoprim] Hives   Sulfa Antibiotics Hives   Keflex [Cephalexin] Rash    Past Medical History:  Diagnosis Date   Anxiety    Diabetes mellitus without complication (HCC)    Hypertension    Neuropathy    Past Surgical History:  Procedure Laterality Date   CESAREAN SECTION     5   TOTAL ABDOMINAL HYSTERECTOMY     Family History  Problem Relation Age of Onset   Heart attack Mother 21   Anxiety disorder Mother    Anxiety disorder Sister    Multiple sclerosis Sister    Social History   Socioeconomic History   Marital status: Married    Spouse name: Not on file   Number of children: Not on file   Years of education: Not on file   Highest education level: 10th grade  Occupational History   Not on file  Tobacco Use   Smoking status: Never    Passive exposure: Never   Smokeless tobacco: Never  Vaping Use  Vaping Use: Never used  Substance and Sexual Activity   Alcohol use: Never   Drug use: Never   Sexual activity: Not on file  Other Topics Concern   Not on file  Social History Narrative   Not on file   Social Determinants of Health   Financial Resource Strain: Low Risk  (04/23/2023)   Overall Financial Resource  Strain (CARDIA)    Difficulty of Paying Living Expenses: Not very hard  Food Insecurity: Food Insecurity Present (04/23/2023)   Hunger Vital Sign    Worried About Running Out of Food in the Last Year: Sometimes true    Ran Out of Food in the Last Year: Sometimes true  Transportation Needs: No Transportation Needs (04/23/2023)   PRAPARE - Administrator, Civil Service (Medical): No    Lack of Transportation (Non-Medical): No  Physical Activity: Insufficiently Active (04/23/2023)   Exercise Vital Sign    Days of Exercise per Week: 1 day    Minutes of Exercise per Session: 20 min  Stress: No Stress Concern Present (04/23/2023)   [employer] of Occupational Health - Occupational Stress Questionnaire    Feeling of Stress : Only a little  Social Connections: Moderately Integrated (04/23/2023)   Social Connection and Isolation Panel [NHANES]    Frequency of Communication with Friends and Family: More than three times a week    Frequency of Social Gatherings with Friends and Family: Once a week    Attends Religious Services: 1 to 4 times per year    Active Member of [employer] or Organizations: No    Attends Engineer, structural: Not on file    Marital Status: Married  Catering manager Violence: Not on file    SUBJECTIVE  Review of Systems Constitutional: Patient ***denies any unintentional weight loss or change in strength lntegumentary: Patient ***denies any rashes or pruritus Eyes: Patient denies ***dry eyes ENT: Patient ***denies dry mouth Cardiovascular: Patient ***denies chest pain or syncope Respiratory: Patient ***denies shortness of breath Gastrointestinal: Patient ***denies nausea, vomiting, constipation, or diarrhea Musculoskeletal: Patient ***denies muscle cramps or weakness Neurologic: Patient ***denies convulsions or seizures Psychiatric: Patient ***denies memory problems Allergic/Immunologic: Patient ***denies recent allergic  reaction(s) Hematologic/Lymphatic: Patient denies bleeding tendencies Endocrine: Patient ***denies heat/cold intolerance  GU: As per HPI.  OBJECTIVE There were no vitals filed for this visit. There is no height or weight on file to calculate BMI.  Physical Examination  Constitutional: General appearance: Well nourished, well developed in no acute distress. Psych: Normal mood and affect, alert and oriented x 3 Neck: Supple, normal appearance.  Respiratory: Normal respiratory effort.  Cardiovascular: No lower extremity edema.  Skin: Warm and dry, no lesions. Lymphatic: No inguinal lymphadenopathy. Abdomen: No masses. No abdominal tenderness. No hernias. No hepatosplenomegaly. No guarding or rebound. Surgical scars ***Pfannensteil / ***laparoscopic port sites/***.  Detailed Urogynecologic Evaluation: Pelvic Exam: External genitalia  {desc; normal/abnormal:32111} in appearance and {Desc; negative/positive:13464} for erythema, lesions or discharge. Bartholin's and Skene's glands  in appearance. She has  sensation to light touch. ***She has an {Desc; intact/impaired:30302} anal wink. There {ACTION; IS/IS NOT:21021397} stool contamination of the perineum noted. Urethral meatus midline and {Desc; negative/positive:13464}  for discharge, polyps, prolapse, caruncles, masses, or erythema. Urethra {Desc; negative/positive:13464}  for tenderness, masses, hypermobility, or leak with cough.  Bladder {Desc; negative/positive:13464}  for tenderness or distention. Vagina {Desc; negative/positive:13464}  for discharge, irritation or lesions. ***Atrophic. ***No prolapse. ***Levator diastasis. Uterus {Desc; negative/positive:13464}  for masses, enlargement or tenderness. ****Surgically absent.  Adnexa {  Desc; negative/positive:13464}  for masses or tenderness.  Pelvic floor {ACTION; IS/IS ONG:29528413} hypertonic. Pelvic floor {ACTION; IS/IS NOT:21021397} tender to palpation.  POP-Q EXAM  Aa: ***  Ba:  ***  Ap: ***  Bp: ***  C: ***  D: ***  Gh: ***  Pb: ***  TVL: ***  Staging  ***    Rectal Exam: Anal verge looks {desc; normal/abnormal:32111} {With/without:5700} hemorrhoids. Resting anal sphincter tone appears {desc; normal/abnormal:32111}. Squeeze sphincter tone appears {desc; normal/abnormal:32111}.  Enterocele / distal rectocele {DESC; PRESENT/ABSENT:17923}. Rectal masses {DESC; PRESENT/ABSENT:17923}. Dyssynergia {DESC; PRESENT/ABSENT:17923} when asking the patient to bear down.   Pelvic exam was chaperoned by ***.  UA: {Desc; negative/positive:13464} *** WBC/hpf, *** RBC/hpf, bacteria (***) *** nitrites, *** leukocytes, *** blood PVR: *** ml  ASSESSMENT No diagnosis found.  ***Recurrent UTls / ***History of UTls with insufficient urine culture data to formally validate recurrent UTI diagnosis: *** 2. ***Vaginal atrophy: ***  Will plan for follow up in *** months or sooner if needed. Pt verbalized understanding and agreement. All questions were answered.  PLAN Advised the following: 1. *** 2. ***No follow-ups on file.  No orders of the defined types were placed in this encounter.   It has been explained that the patient is to follow regularly with their PCP in addition to all other providers involved in their care and to follow instructions provided by these respective offices. Patient advised to contact urology clinic if any urologic-pertaining questions, concerns, new symptoms or problems arise in the interim period.  There are no Patient Instructions on file for this visit.  Electronically signed by:  Donnita Falls, MSN, FNP-C, CUNP 06/22/2023 5:18 PM

## 2023-06-25 ENCOUNTER — Ambulatory Visit: Payer: Medicaid Other | Admitting: Urology

## 2023-06-25 DIAGNOSIS — R399 Unspecified symptoms and signs involving the genitourinary system: Secondary | ICD-10-CM

## 2023-06-25 DIAGNOSIS — R300 Dysuria: Secondary | ICD-10-CM

## 2023-06-25 DIAGNOSIS — Z87440 Personal history of urinary (tract) infections: Secondary | ICD-10-CM

## 2023-06-25 NOTE — Progress Notes (Deleted)
[name] [birth year] [id number]  History of Present Illness: [name] is a 46 y.o. female who presents today as a new patient at [facility]. All available relevant medical records have been reviewed.    She reports chief complaint of recurrent LUTS / UTI symptoms. She reports *** UTl's in the last year. When present, UTI symptoms include ***dysuria, ***increased urinary urgency, ***frequency, ***. She reports that UTI symptoms do ***not seem to correlate with intercourse.  Recent history:  - 03/10/2023: Urine culture positive for 10-25k E. Coli. Treated with Macrobid. - 04/06/2023: Telemedicine visit for UTI symptoms. Treated with Macrobid. - 04/14/2023: PCP called in Cipro 250 mg 2x/day x3 days for reported UTI symptoms. - 04/23/2023: Seen by PCP. Reported UTI symptoms including dysuria and lower abdominal discomfort. Per note: "She states that her symptoms resolved when she is taking antibiotics but quickly returned after she finishes them. She endorses a history of recurrent UTI, noting that she had to take Macrobid for the duration of her 4 pregnancies. She has never been evaluated by urology." Treated with Macrobid; urine culture came back negative. - 04/30/2023: PCP called in Macrobid for reported UTI symptoms. - 05/06/2023: Seen in ER for abdominal cramping and persistent dysuria. CBC & CMP unremarkable. UA negative. PVR <10 ml. CT abdomen/pelvis w/ contrast negative for GU stones, masses, or hydronephrosis. Was prescribed Bentyl.  Urine culture results in past 12 months: - 07/30/2022: Negative - 03/10/2023: Positive for 10-25k E. Coli - 04/23/2023: Negative  Urinary Symptoms: She reports *** UTl's in the last year. When present, UTI symptoms include ***dysuria, ***increased urinary urgency, ***frequency, ***. She reports that UTI symptoms do ***not seem to correlate with intercourse. She {Actions; denies-reports:120008} acute UTI symptoms today.  At  baseline: She {Actions; denies-reports:120008} urinary urgency, frequency, dysuria, gross hematuria, hesitancy, straining to void, or sensations of incomplete emptying. She reports voiding *** times per day and *** times at night. She {Actions; denies-reports:120008} pushing on a bulge in order to empty her bladder.  She {Actions; denies-reports:120008} urge incontinence. She {Actions; denies-reports:120008} stress incontinence with ***cough/***laugh/***sneeze/***heavy lifting/***exercise. She {Actions; denies-reports:120008} enuresis. She leaks *** times per ***. Wears *** ***pads / ***diapers per day. She states the ***SUI / ***UUI is predominant. This has been going on for {NUMBERS 1-12:18279} {days/wks/mos/yrs:310907} and {ACTION; IS/IS QMV:78469629} significantly bothersome. In terms of treatment, She has tried ***.  She {Actions; denies-reports:120008} caffeine intake.  She {Actions; denies-reports:120008} history of pyelonephritis.  She {Actions; denies-reports:120008} history of kidney stones.  Vaginal / prolapse Symptoms: She {Actions; denies-reports:120008} vaginal bulge sensation.  She {Actions; denies-reports:120008} seeing a vaginal bulge. This bulge is bothersome and is the size of ***. It was first noticed ***.  She {Actions; denies-reports:120008} vaginal pain, bleeding, or discharge.  She {Actions; denies-reports:120008} use of topical vaginal estrogen cream.  Bowel Symptoms: She has a continent bowel movement *** time(s) per ***. Typical Bristol stool scale of continent bowel episodes is Type ***. She {Actions; denies-reports:120008} Fl episodes. Fecal incontinence started *** and occurs *** times per week. Typical Bristol stool scale of incontinent bowel episodes is Type ***. She {Actions; denies-reports:120008} straining and {Actions; denies-reports:120008} splinting to defecate. She {Actions; denies-reports:120008} rectal bleeding. She {Actions; denies-reports:120008}  feeling like she fully empties her rectum with defecation. She {Actions; denies-reports:120008} urgency with defecation. She {Actions; denies-reports:120008} difficulty wiping clean. She {Actions; denies-reports:120008} taking laxatives, stool softeners, or fiber supplements. Last colonoscopy was***.  Past OB/GYN History: OB History   No obstetric history on file.  She {Actions; denies-reports:120008} being sexually active.  She {Actions; denies-reports:120008} dyspareunia. ***Dyspareunia is located ***at the introitus / ***deep inside the vagina. She has *** child(ren) who were delivered ***vaginally ***via c-section. She {Actions; denies-reports:120008} significant tearing with that ***delivery / those ***deliveries. She is {DESC; PRE/POST:32304} menopausal.  Last pap smear was ***. She {Actions; denies-reports:120008} history of *** hysterectomy.  Fall Screening: Do you usually have a device to assist in your mobility? {yes/no:20286} ***cane / ***walker / ***wheelchair   Medications: Current Outpatient Medications  Medication Sig Dispense Refill   Accu-Chek Softclix Lancets lancets Use as instructed 100 each 12   albuterol (VENTOLIN HFA) 108 (90 Base) MCG/ACT inhaler Inhale 2 puffs into the lungs every 6 (six) hours as needed for wheezing or shortness of breath. 8 g 0   atorvastatin (LIPITOR) 20 MG tablet Take 1 tablet (20 mg total) by mouth daily. 90 tablet 3   blood glucose meter kit and supplies KIT Dispense based on patient and insurance preference. Use up to four times daily as directed. 1 each 0   Blood Glucose Monitoring Suppl (ACCU-CHEK GUIDE ME) w/Device KIT 1 Device by Does not apply route 4 (four) times daily. 1 kit 0   Cholecalciferol 1.25 MG (50000 UT) capsule Take 1 capsule (50,000 Units total) by mouth once a week. 8 capsule 0   dicyclomine (BENTYL) 20 MG tablet Take 1 tablet (20 mg total) by mouth every 6 (six) hours as needed for up to 7 days for spasms. 25 tablet 0    DULoxetine (CYMBALTA) 30 MG capsule Take 1 capsule (30 mg total) by mouth daily. 30 capsule 2   DULoxetine (CYMBALTA) 60 MG capsule Take 1 capsule (60 mg total) by mouth daily. 30 capsule 2   gabapentin (NEURONTIN) 100 MG capsule Take 1 capsule (100 mg total) by mouth 3 (three) times daily. Three times daily 270 capsule 0   glucose blood (ACCU-CHEK GUIDE) test strip Use as instructed 100 each 12   metFORMIN (GLUCOPHAGE-XR) 500 MG 24 hr tablet Take 1 tablet (500 mg total) by mouth daily with breakfast. 90 tablet 3   olmesartan (BENICAR) 20 MG tablet Take 1 tablet (20 mg total) by mouth daily. 90 tablet 0   Semaglutide, 1 MG/DOSE, 4 MG/3ML SOPN Inject 1 mg as directed once a week. 3 mL 0   Semaglutide, 1 MG/DOSE, 4 MG/3ML SOPN Inject 1 mg as directed once a week. 3 mL 0   Semaglutide, 2 MG/DOSE, 8 MG/3ML SOPN Inject 2 mg as directed once a week. 3 mL 0   No current facility-administered medications for this visit.    Allergies: Allergies  Allergen Reactions   Penicillins Anaphylaxis   Bactrim [Sulfamethoxazole-Trimethoprim] Hives   Sulfa Antibiotics Hives   Keflex [Cephalexin] Rash    Past Medical History:  Diagnosis Date   Anxiety    Diabetes mellitus without complication (HCC)    Hypertension    Neuropathy    Past Surgical History:  Procedure Laterality Date   CESAREAN SECTION     5   TOTAL ABDOMINAL HYSTERECTOMY     Family History  Problem Relation Age of Onset   Heart attack Mother 45   Anxiety disorder Mother    Anxiety disorder Sister    Multiple sclerosis Sister    Social History   Socioeconomic History   Marital status: Married    Spouse name: Not on file   Number of children: Not on file   Years of education: Not on file   Highest  education level: 10th grade  Occupational History   Not on file  Tobacco Use   Smoking status: Never    Passive exposure: Never   Smokeless tobacco: Never  Vaping Use   Vaping Use: Never used  Substance and Sexual Activity    Alcohol use: Never   Drug use: Never   Sexual activity: Not on file  Other Topics Concern   Not on file  Social History Narrative   Not on file   Social Determinants of Health   Financial Resource Strain: Low Risk  (04/23/2023)   Overall Financial Resource Strain (CARDIA)    Difficulty of Paying Living Expenses: Not very hard  Food Insecurity: Food Insecurity Present (04/23/2023)   Hunger Vital Sign    Worried About Running Out of Food in the Last Year: Sometimes true    Ran Out of Food in the Last Year: Sometimes true  Transportation Needs: No Transportation Needs (04/23/2023)   PRAPARE - Administrator, Civil Service (Medical): No    Lack of Transportation (Non-Medical): No  Physical Activity: Insufficiently Active (04/23/2023)   Exercise Vital Sign    Days of Exercise per Week: 1 day    Minutes of Exercise per Session: 20 min  Stress: No Stress Concern Present (04/23/2023)   [employer] of Occupational Health - Occupational Stress Questionnaire    Feeling of Stress : Only a little  Social Connections: Moderately Integrated (04/23/2023)   Social Connection and Isolation Panel [NHANES]    Frequency of Communication with Friends and Family: More than three times a week    Frequency of Social Gatherings with Friends and Family: Once a week    Attends Religious Services: 1 to 4 times per year    Active Member of [employer] or Organizations: No    Attends Engineer, structural: Not on file    Marital Status: Married  Catering manager Violence: Not on file    SUBJECTIVE  Review of Systems Constitutional: Patient ***denies any unintentional weight loss or change in strength lntegumentary: Patient ***denies any rashes or pruritus Eyes: Patient denies ***dry eyes ENT: Patient ***denies dry mouth Cardiovascular: Patient ***denies chest pain or syncope Respiratory: Patient ***denies shortness of breath Gastrointestinal: Patient ***denies nausea, vomiting,  constipation, or diarrhea Musculoskeletal: Patient ***denies muscle cramps or weakness Neurologic: Patient ***denies convulsions or seizures Psychiatric: Patient ***denies memory problems Allergic/Immunologic: Patient ***denies recent allergic reaction(s) Hematologic/Lymphatic: Patient denies bleeding tendencies Endocrine: Patient ***denies heat/cold intolerance  GU: As per HPI.  OBJECTIVE There were no vitals filed for this visit. There is no height or weight on file to calculate BMI.  Physical Examination  Constitutional: General appearance: Well nourished, well developed in no acute distress. Psych: Normal mood and affect, alert and oriented x 3 Neck: Supple, normal appearance.  Respiratory: Normal respiratory effort.  Cardiovascular: No lower extremity edema.  Skin: Warm and dry, no lesions. Lymphatic: No inguinal lymphadenopathy. Abdomen: No masses. No abdominal tenderness. No hernias. No hepatosplenomegaly. No guarding or rebound. Surgical scars ***Pfannensteil / ***laparoscopic port sites/***.  Detailed Urogynecologic Evaluation: Pelvic Exam: External genitalia  {desc; normal/abnormal:32111} in appearance and {Desc; negative/positive:13464} for erythema, lesions or discharge. Bartholin's and Skene's glands  in appearance. She has  sensation to light touch. ***She has an {Desc; intact/impaired:30302} anal wink. There {ACTION; IS/IS NOT:21021397} stool contamination of the perineum noted. Urethral meatus midline and {Desc; negative/positive:13464}  for discharge, polyps, prolapse, caruncles, masses, or erythema. Urethra {Desc; negative/positive:13464}  for tenderness, masses, hypermobility, or leak with cough.  Bladder {Desc; negative/positive:13464}  for tenderness or distention. Vagina {Desc; negative/positive:13464}  for discharge, irritation or lesions. ***Atrophic. ***No prolapse. ***Levator diastasis. Uterus {Desc; negative/positive:13464}  for masses, enlargement or  tenderness. ****Surgically absent.  Adnexa {Desc; negative/positive:13464}  for masses or tenderness.  Pelvic floor {ACTION; IS/IS FAO:13086578} hypertonic. Pelvic floor {ACTION; IS/IS NOT:21021397} tender to palpation.  POP-Q EXAM  Aa: ***  Ba: ***  Ap: ***  Bp: ***  C: ***  D: ***  Gh: ***  Pb: ***  TVL: ***  Staging  ***    Rectal Exam: Anal verge looks {desc; normal/abnormal:32111} {With/without:5700} hemorrhoids. Resting anal sphincter tone appears {desc; normal/abnormal:32111}. Squeeze sphincter tone appears {desc; normal/abnormal:32111}.  Enterocele / distal rectocele {DESC; PRESENT/ABSENT:17923}. Rectal masses {DESC; PRESENT/ABSENT:17923}. Dyssynergia {DESC; PRESENT/ABSENT:17923} when asking the patient to bear down.   Pelvic exam was chaperoned by ***.  UA: {Desc; negative/positive:13464} *** WBC/hpf, *** RBC/hpf, bacteria (***) *** nitrites, *** leukocytes, *** blood PVR: *** ml  ASSESSMENT No diagnosis found.  ***Recurrent UTls / ***History of UTls with insufficient urine culture data to formally validate recurrent UTI diagnosis: *** 2. ***Vaginal atrophy: ***  Will plan for follow up in *** months or sooner if needed. Pt verbalized understanding and agreement. All questions were answered.  PLAN Advised the following: 1. *** 2. ***No follow-ups on file.  No orders of the defined types were placed in this encounter.   It has been explained that the patient is to follow regularly with their PCP in addition to all other providers involved in their care and to follow instructions provided by these respective offices. Patient advised to contact urology clinic if any urologic-pertaining questions, concerns, new symptoms or problems arise in the interim period.  There are no Patient Instructions on file for this visit.  Electronically signed by:  Donnita Falls, MSN, FNP-C, CUNP 06/25/2023 8:44 AM

## 2023-06-27 DIAGNOSIS — E669 Obesity, unspecified: Secondary | ICD-10-CM | POA: Diagnosis not present

## 2023-06-27 DIAGNOSIS — R232 Flushing: Secondary | ICD-10-CM | POA: Diagnosis not present

## 2023-06-27 DIAGNOSIS — R0600 Dyspnea, unspecified: Secondary | ICD-10-CM | POA: Diagnosis not present

## 2023-06-27 DIAGNOSIS — I10 Essential (primary) hypertension: Secondary | ICD-10-CM | POA: Diagnosis not present

## 2023-06-27 DIAGNOSIS — E119 Type 2 diabetes mellitus without complications: Secondary | ICD-10-CM | POA: Diagnosis not present

## 2023-06-27 DIAGNOSIS — R42 Dizziness and giddiness: Secondary | ICD-10-CM | POA: Diagnosis not present

## 2023-06-27 DIAGNOSIS — F439 Reaction to severe stress, unspecified: Secondary | ICD-10-CM | POA: Diagnosis not present

## 2023-06-27 DIAGNOSIS — E785 Hyperlipidemia, unspecified: Secondary | ICD-10-CM | POA: Diagnosis not present

## 2023-06-27 DIAGNOSIS — R202 Paresthesia of skin: Secondary | ICD-10-CM | POA: Diagnosis not present

## 2023-06-27 DIAGNOSIS — Z6841 Body Mass Index (BMI) 40.0 and over, adult: Secondary | ICD-10-CM | POA: Diagnosis not present

## 2023-06-27 DIAGNOSIS — R110 Nausea: Secondary | ICD-10-CM | POA: Diagnosis not present

## 2023-06-27 DIAGNOSIS — R079 Chest pain, unspecified: Secondary | ICD-10-CM | POA: Diagnosis not present

## 2023-06-27 DIAGNOSIS — R0789 Other chest pain: Secondary | ICD-10-CM | POA: Diagnosis not present

## 2023-06-27 DIAGNOSIS — F419 Anxiety disorder, unspecified: Secondary | ICD-10-CM | POA: Diagnosis not present

## 2023-06-28 DIAGNOSIS — R0789 Other chest pain: Secondary | ICD-10-CM | POA: Diagnosis not present

## 2023-06-28 DIAGNOSIS — Z419 Encounter for procedure for purposes other than remedying health state, unspecified: Secondary | ICD-10-CM | POA: Diagnosis not present

## 2023-06-28 DIAGNOSIS — Z5329 Procedure and treatment not carried out because of patient's decision for other reasons: Secondary | ICD-10-CM | POA: Diagnosis not present

## 2023-06-29 ENCOUNTER — Ambulatory Visit: Payer: Medicaid Other | Admitting: Urology

## 2023-06-29 ENCOUNTER — Ambulatory Visit: Payer: Medicaid Other | Admitting: Internal Medicine

## 2023-06-29 DIAGNOSIS — Z87440 Personal history of urinary (tract) infections: Secondary | ICD-10-CM

## 2023-06-29 DIAGNOSIS — R079 Chest pain, unspecified: Secondary | ICD-10-CM | POA: Diagnosis not present

## 2023-07-03 ENCOUNTER — Other Ambulatory Visit: Payer: Self-pay | Admitting: Internal Medicine

## 2023-07-03 DIAGNOSIS — F3341 Major depressive disorder, recurrent, in partial remission: Secondary | ICD-10-CM

## 2023-07-05 MED ORDER — DULOXETINE HCL 60 MG PO CPEP
60.0000 mg | ORAL_CAPSULE | Freq: Every day | ORAL | 2 refills | Status: DC
Start: 2023-07-05 — End: 2023-07-12

## 2023-07-06 NOTE — Progress Notes (Deleted)
Name: Nicole Alvarado DOB: 02-12-1976 MRN: [id number]  History of Present Illness: Nicole Alvarado is a 47 y.o. female who presents today as a new patient at [facility] [place]. All available relevant medical records have been reviewed.    She reports chief complaint of recurrent LUTS / UTI symptoms. She reports *** UTl's in the last year. When present, UTI symptoms include ***dysuria, ***increased urinary urgency, ***frequency, ***. She reports that UTI symptoms do ***not seem to correlate with intercourse.  Recent history:  - 03/10/2023: Urine culture positive for 10-25k E. Coli. Treated with Macrobid. - 04/06/2023: Telemedicine visit for UTI symptoms. Treated with Macrobid. - 04/14/2023: PCP called in Cipro 250 mg 2x/day x3 days for reported UTI symptoms. - 04/23/2023: Seen by PCP. Reported UTI symptoms including dysuria and lower abdominal discomfort. Per note: "She states that her symptoms resolved when she is taking antibiotics but quickly returned after she finishes them. She endorses a history of recurrent UTI, noting that she had to take Macrobid for the duration of her 4 pregnancies. She has never been evaluated by urology." Treated with Macrobid; urine culture came back negative. - 04/30/2023: PCP called in Macrobid for reported UTI symptoms. - 05/06/2023: Seen in ER for abdominal cramping and persistent dysuria. CBC & CMP unremarkable. UA negative. PVR <10 ml. CT abdomen/pelvis w/ contrast negative for GU stones, masses, or hydronephrosis. Was prescribed Bentyl.  Urine culture results in past 12 months: - 07/30/2022: Negative - 03/10/2023: Positive for 10-25k E. Coli - 04/23/2023: Negative  Urinary Symptoms: She reports *** UTl's in the last year. When present, UTI symptoms include ***dysuria, ***increased urinary urgency, ***frequency, ***. She reports that UTI symptoms do ***not seem to correlate with intercourse. She {Actions; denies-reports:120008} acute UTI  symptoms today.  At baseline: She {Actions; denies-reports:120008} urinary urgency, frequency, dysuria, gross hematuria, hesitancy, straining to void, or sensations of incomplete emptying. She reports voiding *** times per day and *** times at night. She {Actions; denies-reports:120008} pushing on a bulge in order to empty her bladder.  She {Actions; denies-reports:120008} urge incontinence. She {Actions; denies-reports:120008} stress incontinence with ***cough/***laugh/***sneeze/***heavy lifting/***exercise. She {Actions; denies-reports:120008} enuresis. She leaks *** times per ***. Wears *** ***pads / ***diapers per day. She states the ***SUI / ***UUI is predominant. This has been going on for {NUMBERS 1-12:18279} {days/wks/mos/yrs:310907} and {ACTION; IS/IS JXB:14782956} significantly bothersome. In terms of treatment, She has tried ***.  She {Actions; denies-reports:120008} caffeine intake.  She {Actions; denies-reports:120008} history of pyelonephritis.  She {Actions; denies-reports:120008} history of kidney stones.  Vaginal / prolapse Symptoms: She {Actions; denies-reports:120008} vaginal bulge sensation.  She {Actions; denies-reports:120008} seeing a vaginal bulge. This bulge is bothersome and is the size of ***. It was first noticed ***.  She {Actions; denies-reports:120008} vaginal pain, bleeding, or discharge.  She {Actions; denies-reports:120008} use of topical vaginal estrogen cream.  Bowel Symptoms: She has a continent bowel movement *** time(s) per ***. Typical Bristol stool scale of continent bowel episodes is Type ***. She {Actions; denies-reports:120008} Fl episodes. Fecal incontinence started *** and occurs *** times per week. Typical Bristol stool scale of incontinent bowel episodes is Type ***. She {Actions; denies-reports:120008} straining and {Actions; denies-reports:120008} splinting to defecate. She {Actions; denies-reports:120008} rectal bleeding. She {Actions;  denies-reports:120008} feeling like she fully empties her rectum with defecation. She {Actions; denies-reports:120008} urgency with defecation. She {Actions; denies-reports:120008} difficulty wiping clean. She {Actions; denies-reports:120008} taking laxatives, stool softeners, or fiber supplements. Last colonoscopy was***.  Past OB/GYN History: OB History   No obstetric history  on file.    She {Actions; denies-reports:120008} being sexually active.  She {Actions; denies-reports:120008} dyspareunia. ***Dyspareunia is located ***at the introitus / ***deep inside the vagina. She has *** child(ren) who were delivered ***vaginally ***via c-section. She {Actions; denies-reports:120008} significant tearing with that ***delivery / those ***deliveries. She is {DESC; PRE/POST:32304} menopausal.  Last pap smear was ***. She {Actions; denies-reports:120008} history of *** hysterectomy.  Fall Screening: Do you usually have a device to assist in your mobility? {yes/no:20286} ***cane / ***walker / ***wheelchair   Medications: Current Outpatient Medications  Medication Sig Dispense Refill   Accu-Chek Softclix Lancets lancets Use as instructed 100 each 12   albuterol (VENTOLIN HFA) 108 (90 Base) MCG/ACT inhaler Inhale 2 puffs into the lungs every 6 (six) hours as needed for wheezing or shortness of breath. 8 g 0   atorvastatin (LIPITOR) 20 MG tablet Take 1 tablet (20 mg total) by mouth daily. 90 tablet 3   blood glucose meter kit and supplies KIT Dispense based on patient and insurance preference. Use up to four times daily as directed. 1 each 0   Blood Glucose Monitoring Suppl (ACCU-CHEK GUIDE ME) w/Device KIT 1 Device by Does not apply route 4 (four) times daily. 1 kit 0   Cholecalciferol 1.25 MG (50000 UT) capsule Take 1 capsule (50,000 Units total) by mouth once a week. 8 capsule 0   dicyclomine (BENTYL) 20 MG tablet Take 1 tablet (20 mg total) by mouth every 6 (six) hours as needed for up to 7 days  for spasms. 25 tablet 0   DULoxetine (CYMBALTA) 30 MG capsule Take 1 capsule (30 mg total) by mouth daily. 30 capsule 2   DULoxetine (CYMBALTA) 60 MG capsule Take 1 capsule (60 mg total) by mouth daily. 30 capsule 2   gabapentin (NEURONTIN) 100 MG capsule Take 1 capsule (100 mg total) by mouth 3 (three) times daily. Three times daily 270 capsule 0   glucose blood (ACCU-CHEK GUIDE) test strip Use as instructed 100 each 12   metFORMIN (GLUCOPHAGE-XR) 500 MG 24 hr tablet Take 1 tablet (500 mg total) by mouth daily with breakfast. 90 tablet 3   olmesartan (BENICAR) 20 MG tablet Take 1 tablet (20 mg total) by mouth daily. 90 tablet 0   Semaglutide, 1 MG/DOSE, 4 MG/3ML SOPN Inject 1 mg as directed once a week. 3 mL 0   Semaglutide, 1 MG/DOSE, 4 MG/3ML SOPN Inject 1 mg as directed once a week. 3 mL 0   Semaglutide, 2 MG/DOSE, 8 MG/3ML SOPN Inject 2 mg as directed once a week. 3 mL 0   No current facility-administered medications for this visit.    Allergies: Allergies  Allergen Reactions   Penicillins Anaphylaxis   Bactrim [Sulfamethoxazole-Trimethoprim] Hives   Sulfa Antibiotics Hives   Keflex [Cephalexin] Rash    Past Medical History:  Diagnosis Date   Anxiety    Diabetes mellitus without complication (HCC)    Hypertension    Neuropathy    Past Surgical History:  Procedure Laterality Date   CESAREAN SECTION     5   TOTAL ABDOMINAL HYSTERECTOMY     Family History  Problem Relation Age of Onset   Heart attack Mother 69   Anxiety disorder Mother    Anxiety disorder Sister    Multiple sclerosis Sister    Social History   Socioeconomic History   Marital status: Married    Spouse name: Not on file   Number of children: Not on file   Years of education: Not  on file   Highest education level: 10th grade  Occupational History   Not on file  Tobacco Use   Smoking status: Never    Passive exposure: Never   Smokeless tobacco: Never  Vaping Use   Vaping Use: Never used   Substance and Sexual Activity   Alcohol use: Never   Drug use: Never   Sexual activity: Not on file  Other Topics Concern   Not on file  Social History Narrative   Not on file   Social Determinants of Health   Financial Resource Strain: Low Risk  (04/23/2023)   Overall Financial Resource Strain (CARDIA)    Difficulty of Paying Living Expenses: Not very hard  Food Insecurity: Food Insecurity Present (04/23/2023)   Hunger Vital Sign    Worried About Running Out of Food in the Last Year: Sometimes true    Ran Out of Food in the Last Year: Sometimes true  Transportation Needs: No Transportation Needs (04/23/2023)   PRAPARE - Administrator, Civil Service (Medical): No    Lack of Transportation (Non-Medical): No  Physical Activity: Insufficiently Active (04/23/2023)   Exercise Vital Sign    Days of Exercise per Week: 1 day    Minutes of Exercise per Session: 20 min  Stress: No Stress Concern Present (04/23/2023)   [employer] of Occupational Health - Occupational Stress Questionnaire    Feeling of Stress : Only a little  Social Connections: Moderately Integrated (04/23/2023)   Social Connection and Isolation Panel [NHANES]    Frequency of Communication with Friends and Family: More than three times a week    Frequency of Social Gatherings with Friends and Family: Once a week    Attends Religious Services: 1 to 4 times per year    Active Member of [employer] or Organizations: No    Attends Engineer, structural: Not on file    Marital Status: Married  Catering manager Violence: Not on file    SUBJECTIVE  Review of Systems Constitutional: Patient ***denies any unintentional weight loss or change in strength lntegumentary: Patient ***denies any rashes or pruritus Eyes: Patient denies ***dry eyes ENT: Patient ***denies dry mouth Cardiovascular: Patient ***denies chest pain or syncope Respiratory: Patient ***denies shortness of breath Gastrointestinal: Patient  ***denies nausea, vomiting, constipation, or diarrhea Musculoskeletal: Patient ***denies muscle cramps or weakness Neurologic: Patient ***denies convulsions or seizures Psychiatric: Patient ***denies memory problems Allergic/Immunologic: Patient ***denies recent allergic reaction(s) Hematologic/Lymphatic: Patient denies bleeding tendencies Endocrine: Patient ***denies heat/cold intolerance  GU: As per HPI.  OBJECTIVE There were no vitals filed for this visit. There is no height or weight on file to calculate BMI.  Physical Examination Constitutional: ***No obvious distress; patient is ***non-toxic appearing  Cardiovascular: ***No visible lower extremity edema.  Respiratory: The patient does ***not have audible wheezing/stridor; respirations do ***not appear labored  Gastrointestinal: Abdomen ***non-distended Musculoskeletal: ***Normal ROM of UEs  Skin: ***No obvious rashes/open sores  Neurologic: CN 2-12 grossly ***intact Psychiatric: Answered questions ***appropriately with ***normal affect  Hematologic/Lymphatic/Immunologic: ***No obvious bruises or sites of spontaneous bleeding  UA: {Desc; negative/positive:13464} for *** WBC/hpf, *** RBC/hpf, bacteria (***) *** nitrites, *** leukocytes, *** blood PVR: *** ml  ASSESSMENT No diagnosis found.  ***Recurrent UTls / ***History of UTls with insufficient urine culture data to formally validate recurrent UTI diagnosis: *** 2. ***Vaginal atrophy: ***  Will plan for follow up in *** months or sooner if needed. Pt verbalized understanding and agreement. All questions were answered.  PLAN Advised the following: 1. ***  2. ***No follow-ups on file.  No orders of the defined types were placed in this encounter.   It has been explained that the patient is to follow regularly with their PCP in addition to all other providers involved in their care and to follow instructions provided by these respective offices. Patient advised to  contact urology clinic if any urologic-pertaining questions, concerns, new symptoms or problems arise in the interim period.  There are no Patient Instructions on file for this visit.  Electronically signed by:  Donnita Falls, MSN, FNP-C, CUNP 07/06/2023 3:06 PM

## 2023-07-08 ENCOUNTER — Ambulatory Visit: Payer: Medicaid Other | Admitting: Urology

## 2023-07-08 DIAGNOSIS — Z87440 Personal history of urinary (tract) infections: Secondary | ICD-10-CM

## 2023-07-11 ENCOUNTER — Other Ambulatory Visit: Payer: Self-pay | Admitting: Internal Medicine

## 2023-07-11 DIAGNOSIS — F3341 Major depressive disorder, recurrent, in partial remission: Secondary | ICD-10-CM

## 2023-07-13 ENCOUNTER — Other Ambulatory Visit: Payer: Self-pay | Admitting: Internal Medicine

## 2023-07-13 DIAGNOSIS — E1140 Type 2 diabetes mellitus with diabetic neuropathy, unspecified: Secondary | ICD-10-CM

## 2023-07-19 ENCOUNTER — Telehealth: Payer: Self-pay | Admitting: Internal Medicine

## 2023-07-19 NOTE — Telephone Encounter (Signed)
Pt called in need pre auth on [facility]

## 2023-07-19 NOTE — Telephone Encounter (Signed)
PA has been submitted.

## 2023-07-23 ENCOUNTER — Encounter: Payer: Self-pay | Admitting: Internal Medicine

## 2023-07-23 ENCOUNTER — Ambulatory Visit (INDEPENDENT_AMBULATORY_CARE_PROVIDER_SITE_OTHER): Payer: Medicaid Other | Admitting: Internal Medicine

## 2023-07-23 ENCOUNTER — Telehealth: Payer: Self-pay | Admitting: Internal Medicine

## 2023-07-23 VITALS — BP 118/82 | HR 90 | Ht 67.5 in | Wt 315.2 lb

## 2023-07-23 DIAGNOSIS — E1169 Type 2 diabetes mellitus with other specified complication: Secondary | ICD-10-CM | POA: Diagnosis not present

## 2023-07-23 DIAGNOSIS — F3341 Major depressive disorder, recurrent, in partial remission: Secondary | ICD-10-CM | POA: Diagnosis not present

## 2023-07-23 DIAGNOSIS — E1140 Type 2 diabetes mellitus with diabetic neuropathy, unspecified: Secondary | ICD-10-CM

## 2023-07-23 DIAGNOSIS — E559 Vitamin D deficiency, unspecified: Secondary | ICD-10-CM | POA: Diagnosis not present

## 2023-07-23 DIAGNOSIS — E785 Hyperlipidemia, unspecified: Secondary | ICD-10-CM | POA: Diagnosis not present

## 2023-07-23 NOTE — Progress Notes (Signed)
Established Patient Office Visit  Subjective   Patient ID: Nicole Alvarado, female    DOB: 08/16/1976  Age: 47 y.o. MRN: [id number]  Chief Complaint  Patient presents with   Diabetes    Follow up    [name] returns to care today for routine follow-up.  She was last evaluated by me on 4/26.  At that time Ozempic was increased to 2 mg weekly.  She was also started on atorvastatin 20 mg daily.  Treated for UTI with Macrobid x 5 days.  1 month follow-up was recommended.  In the interim she presented to the emergency department on 5/9 endorsing chest pain.  Cardiac workup was unremarkable.  There have otherwise been no acute interval events.  [name] reports feeling okay today.  She states that her father died last month and she has been grieving.  She does not have any acute concerns to discuss today.  Past Medical History:  Diagnosis Date   Anxiety    Diabetes mellitus without complication (HCC)    Hypertension    Neuropathy    Past Surgical History:  Procedure Laterality Date   CESAREAN SECTION     5   TOTAL ABDOMINAL HYSTERECTOMY     Social History   Tobacco Use   Smoking status: Never    Passive exposure: Never   Smokeless tobacco: Never  Vaping Use   Vaping status: Never Used  Substance Use Topics   Alcohol use: Never   Drug use: Never   Family History  Problem Relation Age of Onset   Heart attack Mother 53   Anxiety disorder Mother    Anxiety disorder Sister    Multiple sclerosis Sister    Allergies  Allergen Reactions   Penicillins Anaphylaxis   Bactrim [Sulfamethoxazole-Trimethoprim] Hives   Sulfa Antibiotics Hives   Keflex [Cephalexin] Rash   Review of Systems  Constitutional:  Negative for chills and fever.  HENT:  Negative for sore throat.   Respiratory:  Negative for cough and shortness of breath.   Cardiovascular:  Negative for chest pain, palpitations and leg swelling.  Gastrointestinal:  Negative for abdominal pain, blood in stool,  constipation, diarrhea, nausea and vomiting.  Genitourinary:  Negative for dysuria and hematuria.  Musculoskeletal:  Negative for myalgias.  Skin:  Negative for itching and rash.  Neurological:  Negative for dizziness and headaches.  Psychiatric/Behavioral:  Negative for depression and suicidal ideas.      Objective:     BP 118/82   Pulse 90   Ht 5' 7.5" (1.715 m)   Wt (!) 315 lb 3.2 oz (143 kg)   SpO2 97%   BMI 48.64 kg/m  BP Readings from Last 3 Encounters:  07/23/23 118/82  05/06/23 110/76  04/23/23 108/70   Physical Exam Vitals reviewed.  Constitutional:      General: She is not in acute distress.    Appearance: Normal appearance. She is obese. She is not toxic-appearing.  HENT:     Head: Normocephalic and atraumatic.     Right Ear: External ear normal.     Left Ear: External ear normal.     Nose: Nose normal. No congestion or rhinorrhea.     Mouth/Throat:     Mouth: Mucous membranes are moist.     Pharynx: Oropharynx is clear. No oropharyngeal exudate or posterior oropharyngeal erythema.  Eyes:     General: No scleral icterus.    Extraocular Movements: Extraocular movements intact.     Conjunctiva/sclera: Conjunctivae normal.  Pupils: Pupils are equal, round, and reactive to light.  Cardiovascular:     Rate and Rhythm: Normal rate and regular rhythm.     Pulses: Normal pulses.     Heart sounds: Normal heart sounds. No murmur heard.    No friction rub. No gallop.  Pulmonary:     Effort: Pulmonary effort is normal.     Breath sounds: Normal breath sounds. No wheezing, rhonchi or rales.  Abdominal:     General: Abdomen is flat. Bowel sounds are normal. There is no distension.     Palpations: Abdomen is soft.     Tenderness: There is no abdominal tenderness.  Musculoskeletal:        General: No swelling. Normal range of motion.     Cervical back: Normal range of motion.     Right lower leg: No edema.     Left lower leg: No edema.  Lymphadenopathy:      Cervical: No cervical adenopathy.  Skin:    General: Skin is warm and dry.     Capillary Refill: Capillary refill takes less than 2 seconds.     Coloration: Skin is not jaundiced.  Neurological:     General: No focal deficit present.     Mental Status: She is alert and oriented to person, place, and time.  Psychiatric:        Mood and Affect: Mood normal.        Behavior: Behavior normal.   Last CBC Lab Results  Component Value Date   WBC 10.1 05/06/2023   HGB 14.8 05/06/2023   HCT 45.1 05/06/2023   MCV 87.9 05/06/2023   MCH 28.8 05/06/2023   RDW 11.9 05/06/2023   PLT 332 05/06/2023   Last metabolic panel Lab Results  Component Value Date   GLUCOSE 149 (H) 05/06/2023   NA 136 05/06/2023   K 4.1 05/06/2023   CL 101 05/06/2023   CO2 28 05/06/2023   BUN 14 05/06/2023   CREATININE 0.99 05/06/2023   GFRNONAA >60 05/06/2023   CALCIUM 9.1 05/06/2023   PROT 7.1 05/06/2023   ALBUMIN 3.7 05/06/2023   LABGLOB 2.6 02/24/2023   AGRATIO 1.5 02/24/2023   BILITOT 0.6 05/06/2023   ALKPHOS 54 05/06/2023   AST 22 05/06/2023   ALT 28 05/06/2023   ANIONGAP 7 05/06/2023   Last lipids Lab Results  Component Value Date   CHOL 196 02/24/2023   HDL 49 02/24/2023   LDLCALC 129 (H) 02/24/2023   TRIG 101 02/24/2023   CHOLHDL 4.0 02/24/2023   Last hemoglobin A1c Lab Results  Component Value Date   HGBA1C 10.3 (H) 02/24/2023   Last thyroid functions Lab Results  Component Value Date   TSH 0.871 02/24/2023   Last vitamin D Lab Results  Component Value Date   VD25OH 7.0 (L) 02/24/2023   The 10-year ASCVD risk score (Arnett DK, et al., 2019) is: 2.2%    Assessment & Plan:   Problem List Items Addressed This Visit       Type 2 diabetes mellitus with diabetic neuropathy, without long-term current use of insulin (HCC) - Primary    A1c 10.3 in February.  Ozempic was increased to 2 mg weekly at her last appointment.  She is additionally prescribed metformin XR 500 mg daily.   Unable to tolerate higher doses.  She does not regularly check her blood sugar. -Repeat A1c ordered today.  Further medication adjustments will depend on results.      Hyperlipidemia associated with type 2  diabetes mellitus (HCC)    Lipid panel updated in February.  Atorvastatin 20 mg daily was started for improved LDL control in the setting of diabetes mellitus.  She has not experienced any adverse side effects since starting atorvastatin.  A repeat lipid panel has been ordered today.      Recurrent major depressive disorder, in partial remission (HCC)    Mood remains stable with Cymbalta.  No medication changes have been made today.      Morbid obesity (HCC)    BMI 48.6.  Her weight has not changed since her last appointment.  She is currently prescribed Ozempic 2 mg weekly.  For now, she would like to continue Ozempic but is potentially interested in referral to bariatric surgery in the future.      Vitamin D deficiency    Noted on previous labs.  She has completed high-dose, weekly vitamin D supplementation.  A repeat vitamin D level has been ordered today.       Return in about 3 months (around 10/23/2023).   Billie Lade, MD

## 2023-07-23 NOTE — Assessment & Plan Note (Signed)
Noted on previous labs.  She has completed high-dose, weekly vitamin D supplementation.  A repeat vitamin D level has been ordered today.

## 2023-07-23 NOTE — Telephone Encounter (Signed)
Pt called wanting to let Dr. Durwin Nora know that she uploaded paper work into [employer].

## 2023-07-23 NOTE — Assessment & Plan Note (Signed)
Lipid panel updated in February.  Atorvastatin 20 mg daily was started for improved LDL control in the setting of diabetes mellitus.  She has not experienced any adverse side effects since starting atorvastatin.  A repeat lipid panel has been ordered today.

## 2023-07-23 NOTE — Telephone Encounter (Signed)
Got paperwork

## 2023-07-23 NOTE — Assessment & Plan Note (Signed)
BMI 48.6.  Her weight has not changed since her last appointment.  She is currently prescribed Ozempic 2 mg weekly.  For now, she would like to continue Ozempic but is potentially interested in referral to bariatric surgery in the future.

## 2023-07-23 NOTE — Assessment & Plan Note (Signed)
Mood remains stable with Cymbalta.  No medication changes have been made today.

## 2023-07-23 NOTE — Patient Instructions (Signed)
It was a pleasure to see you today.  Thank you for giving us the opportunity to be involved in your care.  Below is a brief recap of your visit and next steps.  We will plan to see you again in 3 months.  Summary No medication changes today Repeat labs ordered Follow up in 3 months  

## 2023-07-23 NOTE — Assessment & Plan Note (Signed)
A1c 10.3 in February.  Ozempic was increased to 2 mg weekly at her last appointment.  She is additionally prescribed metformin XR 500 mg daily.  Unable to tolerate higher doses.  She does not regularly check her blood sugar. -Repeat A1c ordered today.  Further medication adjustments will depend on results.

## 2023-07-26 ENCOUNTER — Telehealth: Payer: Self-pay | Admitting: Internal Medicine

## 2023-07-26 NOTE — Telephone Encounter (Signed)
Placard  Copied Noted Sleeved (placed in provider box)  Call patient when ready for pick up

## 2023-07-29 DIAGNOSIS — Z419 Encounter for procedure for purposes other than remedying health state, unspecified: Secondary | ICD-10-CM | POA: Diagnosis not present

## 2023-07-29 NOTE — Telephone Encounter (Signed)
Called placard ready for pick up

## 2023-07-30 DIAGNOSIS — E559 Vitamin D deficiency, unspecified: Secondary | ICD-10-CM | POA: Diagnosis not present

## 2023-07-30 DIAGNOSIS — E1140 Type 2 diabetes mellitus with diabetic neuropathy, unspecified: Secondary | ICD-10-CM | POA: Diagnosis not present

## 2023-07-30 DIAGNOSIS — E785 Hyperlipidemia, unspecified: Secondary | ICD-10-CM | POA: Diagnosis not present

## 2023-07-30 DIAGNOSIS — E1169 Type 2 diabetes mellitus with other specified complication: Secondary | ICD-10-CM | POA: Diagnosis not present

## 2023-07-30 NOTE — Telephone Encounter (Signed)
Patient pick up form

## 2023-08-06 ENCOUNTER — Telehealth: Payer: Medicaid Other | Admitting: Physician Assistant

## 2023-08-06 DIAGNOSIS — K047 Periapical abscess without sinus: Secondary | ICD-10-CM | POA: Diagnosis not present

## 2023-08-06 MED ORDER — CLINDAMYCIN HCL 300 MG PO CAPS: 300.0000 mg | ORAL_CAPSULE | Freq: Three times a day (TID) | ORAL | 0 refills | Status: DC

## 2023-08-06 MED ORDER — NAPROXEN 500 MG PO TABS: 500.0000 mg | ORAL_TABLET | Freq: Two times a day (BID) | ORAL | 0 refills | Status: DC

## 2023-08-06 NOTE — Progress Notes (Signed)
E-Visit for Dental Pain  We are sorry that you are not feeling well.  Here is how we plan to help!  Based on what you have shared with me in the questionnaire, it sounds like you have a dental infection.  Clindamycin 300mg  3 times a day for 7 days and Naprosyn 500mg  2 times a day for 7 days for discomfort  It is imperative that you see a dentist within 10 days of this eVisit to determine the cause of the dental pain and be sure it is adequately treated  A toothache or tooth pain is caused when the nerve in the root of a tooth or surrounding a tooth is irritated. Dental (tooth) infection, decay, injury, or loss of a tooth are the most common causes of dental pain. Pain may also occur after an extraction (tooth is pulled out). Pain sometimes originates from other areas and radiates to the jaw, thus appearing to be tooth pain.Bacteria growing inside your mouth can contribute to gum disease and dental decay, both of which can cause pain. A toothache occurs from inflammation of the central portion of the tooth called pulp. The pulp contains nerve endings that are very sensitive to pain. Inflammation to the pulp or pulpitis may be caused by dental cavities, trauma, and infection.    HOME CARE:   For toothaches: Over-the-counter pain medications such as acetaminophen or ibuprofen may be used. Take these as directed on the package while you arrange for a dental appointment. Avoid very cold or hot foods, because they may make the pain worse. You may get relief from biting on a cotton ball soaked in oil of cloves. You can get oil of cloves at most drug stores.  For jaw pain:  Aspirin may be helpful for problems in the joint of the jaw in adults. If pain happens every time you open your mouth widely, the temporomandibular joint (TMJ) may be the source of the pain. Yawning or taking a large bite of food may worsen the pain. An appointment with your doctor or dentist will help you find the cause.      GET HELP RIGHT AWAY IF:  You have a high fever or chills If you have had a recent head or face injury and develop headache, light headedness, nausea, vomiting, or other symptoms that concern you after an injury to your face or mouth, you could have a more serious injury in addition to your dental injury. A facial rash associated with a toothache: This condition may improve with medication. Contact your doctor for them to decide what is appropriate. Any jaw pain occurring with chest pain: Although jaw pain is most commonly caused by dental disease, it is sometimes referred pain from other areas. People with heart disease, especially people who have had stents placed, people with diabetes, or those who have had heart surgery may have jaw pain as a symptom of heart attack or angina. If your jaw or tooth pain is associated with lightheadedness, sweating, or shortness of breath, you should see a doctor as soon as possible. Trouble swallowing or excessive pain or bleeding from gums: If you have a history of a weakened immune system, diabetes, or steroid use, you may be more susceptible to infections. Infections can often be more severe and extensive or caused by unusual organisms. Dental and gum infections in people with these conditions may require more aggressive treatment. An abscess may need draining or IV antibiotics, for example.  MAKE SURE YOU   Understand these  instructions. Will watch your condition. Will get help right away if you are not doing well or get worse.  Thank you for choosing an e-visit.  Your e-visit answers were reviewed by a board certified advanced clinical practitioner to complete your personal care plan. Depending upon the condition, your plan could have included both over the counter or prescription medications.  Please review your pharmacy choice. Make sure the pharmacy is open so you can pick up prescription now. If there is a problem, you may contact your provider  through [employer] and have the prescription routed to another pharmacy.  Your safety is important to [place]. If you have drug allergies check your prescription carefully.   For the next 24 hours you can use MyChart to ask questions about today's visit, request a non-urgent call back, or ask for a work or school excuse. You will get an email in the next two days asking about your experience. I hope that your e-visit has been valuable and will speed your recovery.   I have spent 5 minutes in review of e-visit questionnaire, review and updating patient chart, medical decision making and response to patient.   Margaretann Loveless, PA-C

## 2023-08-11 ENCOUNTER — Other Ambulatory Visit: Payer: Self-pay | Admitting: Internal Medicine

## 2023-08-11 DIAGNOSIS — E1140 Type 2 diabetes mellitus with diabetic neuropathy, unspecified: Secondary | ICD-10-CM

## 2023-08-14 ENCOUNTER — Other Ambulatory Visit: Payer: Self-pay | Admitting: Internal Medicine

## 2023-08-14 DIAGNOSIS — I10 Essential (primary) hypertension: Secondary | ICD-10-CM

## 2023-08-16 MED ORDER — OLMESARTAN MEDOXOMIL 20 MG PO TABS
20.0000 mg | ORAL_TABLET | Freq: Every day | ORAL | 0 refills | Status: DC
Start: 2023-08-16 — End: 2023-10-06

## 2023-08-25 ENCOUNTER — Other Ambulatory Visit: Payer: Self-pay | Admitting: Internal Medicine

## 2023-08-25 DIAGNOSIS — B349 Viral infection, unspecified: Secondary | ICD-10-CM

## 2023-08-25 DIAGNOSIS — E1140 Type 2 diabetes mellitus with diabetic neuropathy, unspecified: Secondary | ICD-10-CM

## 2023-08-26 MED ORDER — ALBUTEROL SULFATE HFA 108 (90 BASE) MCG/ACT IN AERS
2.0000 | INHALATION_SPRAY | Freq: Four times a day (QID) | RESPIRATORY_TRACT | 0 refills | Status: AC | PRN
Start: 2023-08-26 — End: ?

## 2023-08-26 MED ORDER — GABAPENTIN 100 MG PO CAPS
100.0000 mg | ORAL_CAPSULE | Freq: Three times a day (TID) | ORAL | 0 refills | Status: DC
Start: 2023-08-26 — End: 2023-11-29

## 2023-08-26 MED ORDER — OZEMPIC (2 MG/DOSE) 8 MG/3ML ~~LOC~~ SOPN
PEN_INJECTOR | SUBCUTANEOUS | 0 refills | Status: DC
Start: 2023-08-26 — End: 2023-12-03

## 2023-08-29 DIAGNOSIS — Z419 Encounter for procedure for purposes other than remedying health state, unspecified: Secondary | ICD-10-CM | POA: Diagnosis not present

## 2023-09-05 ENCOUNTER — Other Ambulatory Visit: Payer: Self-pay | Admitting: Internal Medicine

## 2023-09-05 DIAGNOSIS — F3341 Major depressive disorder, recurrent, in partial remission: Secondary | ICD-10-CM

## 2023-09-14 ENCOUNTER — Other Ambulatory Visit: Payer: Self-pay | Admitting: Internal Medicine

## 2023-09-14 DIAGNOSIS — F3341 Major depressive disorder, recurrent, in partial remission: Secondary | ICD-10-CM

## 2023-09-14 MED ORDER — DULOXETINE HCL 30 MG PO CPEP
30.0000 mg | ORAL_CAPSULE | Freq: Every day | ORAL | 0 refills | Status: DC
Start: 2023-09-14 — End: 2023-10-06

## 2023-09-24 ENCOUNTER — Encounter: Payer: Self-pay | Admitting: *Deleted

## 2023-09-28 DIAGNOSIS — Z419 Encounter for procedure for purposes other than remedying health state, unspecified: Secondary | ICD-10-CM | POA: Diagnosis not present

## 2023-10-06 ENCOUNTER — Other Ambulatory Visit: Payer: Self-pay | Admitting: Internal Medicine

## 2023-10-06 DIAGNOSIS — F3341 Major depressive disorder, recurrent, in partial remission: Secondary | ICD-10-CM

## 2023-10-06 DIAGNOSIS — I10 Essential (primary) hypertension: Secondary | ICD-10-CM

## 2023-10-07 MED ORDER — DULOXETINE HCL 30 MG PO CPEP
30.0000 mg | ORAL_CAPSULE | Freq: Every day | ORAL | 0 refills | Status: DC
Start: 2023-10-07 — End: 2023-11-12

## 2023-10-07 MED ORDER — DULOXETINE HCL 60 MG PO CPEP: 60.0000 mg | ORAL_CAPSULE | Freq: Every day | ORAL | 0 refills | Status: DC

## 2023-10-07 MED ORDER — OLMESARTAN MEDOXOMIL 20 MG PO TABS
20.0000 mg | ORAL_TABLET | Freq: Every day | ORAL | 0 refills | Status: DC
Start: 2023-10-07 — End: 2023-11-12

## 2023-10-22 ENCOUNTER — Ambulatory Visit: Payer: Medicaid Other | Admitting: Internal Medicine

## 2023-10-29 DIAGNOSIS — Z419 Encounter for procedure for purposes other than remedying health state, unspecified: Secondary | ICD-10-CM | POA: Diagnosis not present

## 2023-11-08 ENCOUNTER — Encounter: Payer: Self-pay | Admitting: Internal Medicine

## 2023-11-08 MED ORDER — PANTOPRAZOLE SODIUM 40 MG PO TBEC: 40.0000 mg | DELAYED_RELEASE_TABLET | Freq: Two times a day (BID) | ORAL | 1 refills | Status: DC

## 2023-11-09 ENCOUNTER — Encounter: Payer: Self-pay | Admitting: Internal Medicine

## 2023-11-09 IMAGING — CT CT TEMPORAL BONES W/O CM
2 of 6 series · 13 of 40 positions shown, 16 images · non-contrast
Comparison: No pertinent prior exam.

CLINICAL DATA: Tenderness over left mastoid air cells, recent
otitis media

EXAM:
CT TEMPORAL BONES WITHOUT CONTRAST
TECHNIQUE: Axial and coronal plane CT imaging of the petrous temporal bones was
performed with thin-collimation image reconstruction. No intravenous
contrast was administered. Multiplanar CT image reconstructions were
also generated.
RADIATION DOSE REDUCTION: This exam was performed according to the
departmental dose-optimization program which includes automated
exposure control, adjustment of the mA and/or kV according to
patient size and/or use of iterative reconstruction technique.

[Series 6: cor mag right · coronal · 0.21mm/px · 2 of 216 slices shown]
[im 72/216  bone]
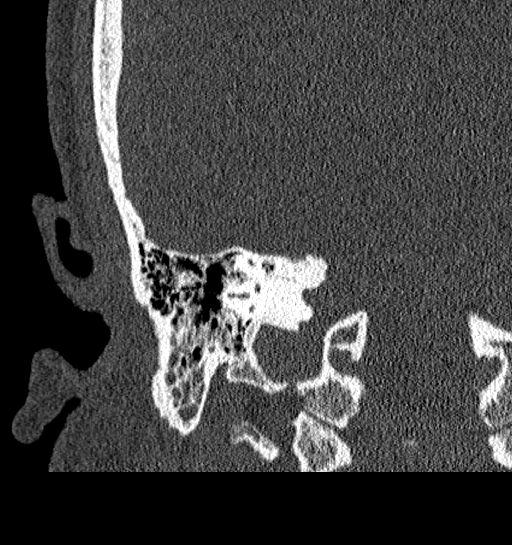
[im 144/216  bone]
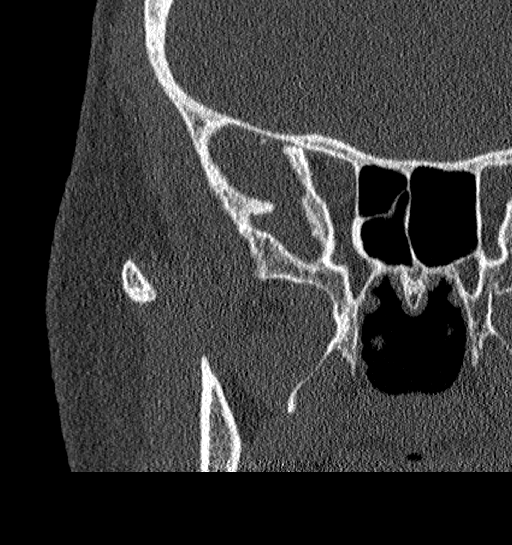

[Series 7: ax mag left · axial · 0.22mm/px · z∈[+766,+850]mm · 11 of 168 slices shown, 14 images]
[im 14/168  brain]
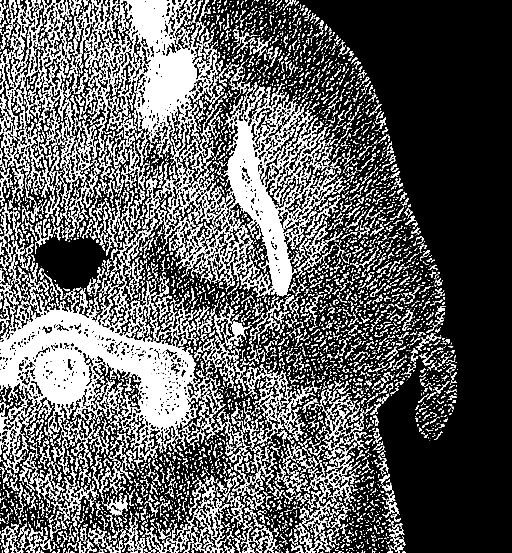
[im 14/168  bone]
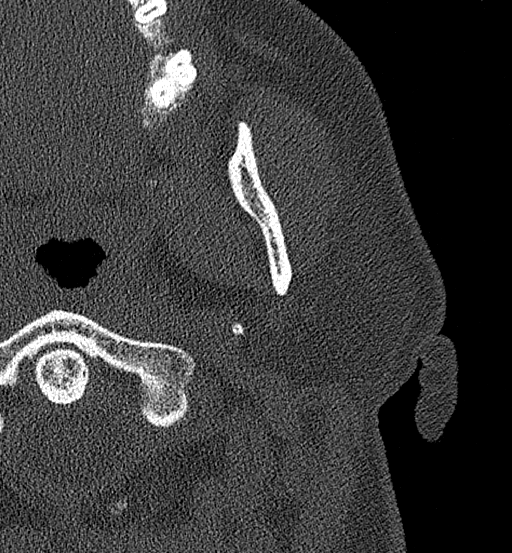
[im 28/168  bone]
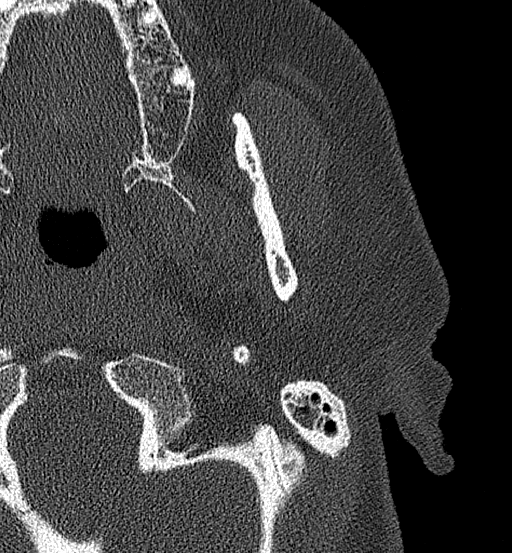
[im 42/168  bone]
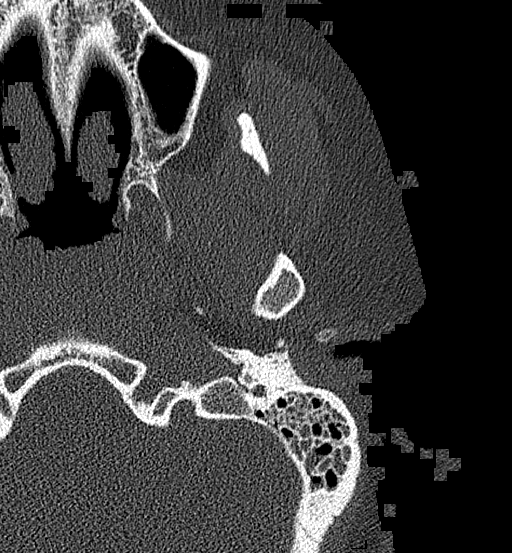
[im 56/168  bone]
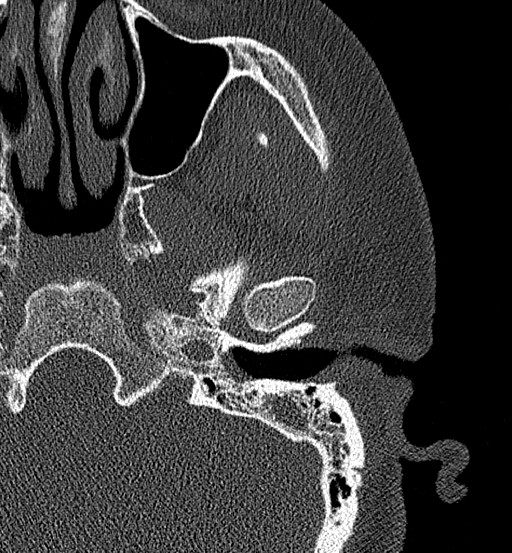
[im 70/168  brain]
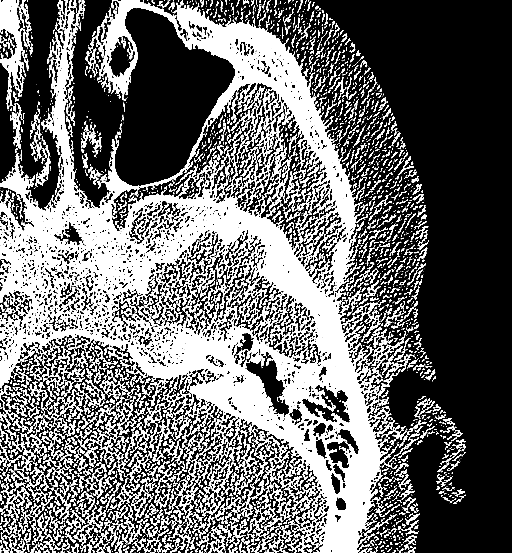
[im 70/168  bone]
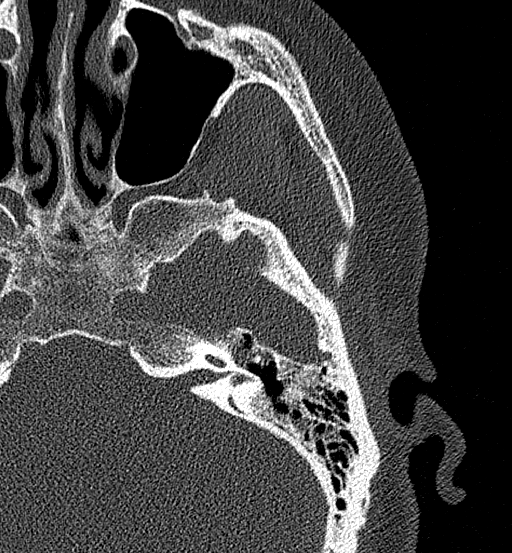
[im 84/168  bone]
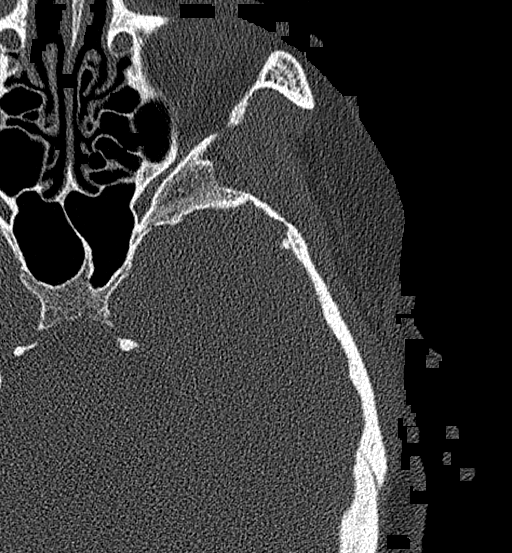
[im 98/168  bone]
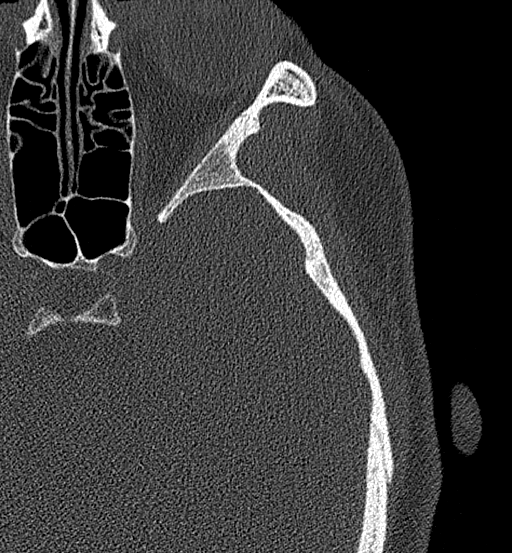
[im 112/168  bone]
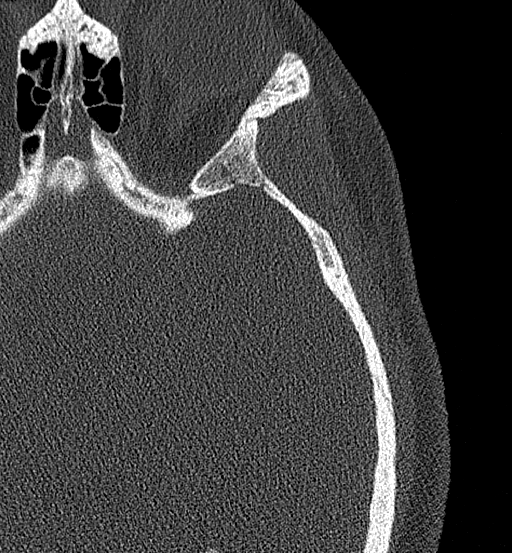
[im 126/168  brain]
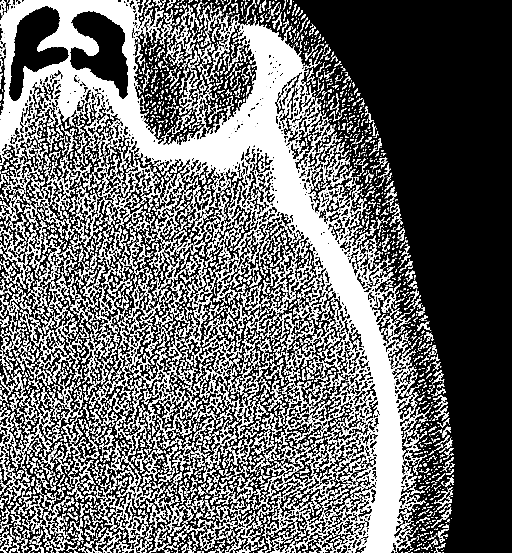
[im 126/168  bone]
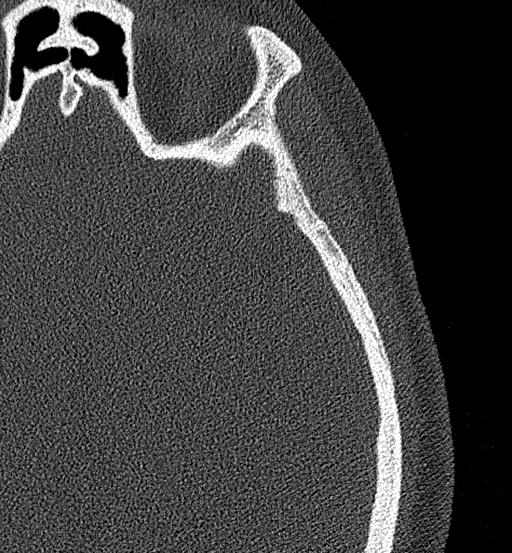
[im 140/168  bone]
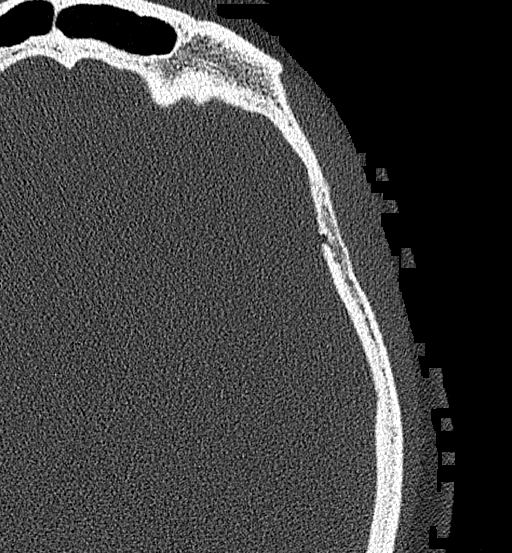
[im 154/168  bone]
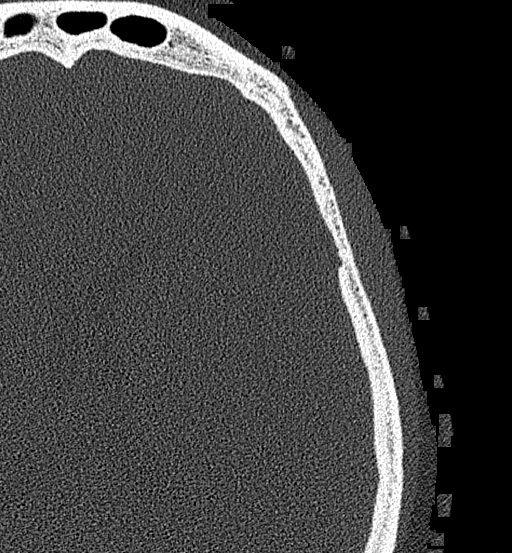

[13 of 40 positions shown; findings below may reference images not displayed]

FINDINGS: RIGHT TEMPORAL BONE

External auditory canal: Normal. The tympanic membrane is not
thickened.

Middle ear cavity: Normally aerated. The scutum and ossicles are
normal. The tegmen tympani is intact.

Inner ear structures: The cochlea, vestibule and semicircular canals
are normal. The vestibular aqueduct is not enlarged.

Internal auditory and facial nerve canals:  Normal

Mastoid air cells: There is a small amount of fluid in the right
mastoid tip. There is no septal destruction. The tegmen mastoideum
is intact.

LEFT TEMPORAL BONE

External auditory canal: External auditory canal is normal. There is
mild retraction of the tympanic membrane.

Middle ear cavity: There is partial opacification of the middle ear
cavity with fluid/soft tissue density surrounding portions of the
ossicles. There is no ossicular or scutal erosion. The tegmen
tympani is intact.

Inner ear structures: The cochlea, vestibule and semicircular canals
are normal. The vestibular aqueduct is not enlarged.

Internal auditory and facial nerve canals:  Normal.

Mastoid air cells: There is a left mastoid effusion without evidence
of septal destruction. The tegmen mastoideum is intact.

Vascular: Normal non-contrast appearance of the carotid canals,
jugular bulbs and sigmoid plates.

Limited intracranial:  No acute or significant finding.

Visible orbits/paranasal sinuses: The globes and orbits are
unremarkable. The imaged paranasal sinuses are clear.

Soft tissues: There is no significant soft tissue swelling over the
left mastoid air cells. There is no organized or drainable fluid
collection.
IMPRESSION: 1. Left mastoid and middle ear effusions with no evidence of
ossicular or scutal erosion or septal destruction. No significant
inflammatory change in the overlying soft tissues, and no organized
or drainable fluid collection.
2. Smaller right mastoid effusion without evidence of septal
destruction.

## 2023-11-12 ENCOUNTER — Other Ambulatory Visit: Payer: Self-pay | Admitting: Internal Medicine

## 2023-11-12 DIAGNOSIS — F3341 Major depressive disorder, recurrent, in partial remission: Secondary | ICD-10-CM

## 2023-11-12 DIAGNOSIS — I10 Essential (primary) hypertension: Secondary | ICD-10-CM

## 2023-11-12 MED ORDER — OLMESARTAN MEDOXOMIL 20 MG PO TABS
20.0000 mg | ORAL_TABLET | Freq: Every day | ORAL | 0 refills | Status: DC
Start: 2023-11-12 — End: 2024-01-17

## 2023-11-12 MED ORDER — DULOXETINE HCL 60 MG PO CPEP
60.0000 mg | ORAL_CAPSULE | Freq: Every day | ORAL | 0 refills | Status: DC
Start: 2023-11-12 — End: 2023-12-09

## 2023-11-12 MED ORDER — DULOXETINE HCL 30 MG PO CPEP: 30.0000 mg | ORAL_CAPSULE | Freq: Every day | ORAL | 0 refills | Status: DC

## 2023-11-28 ENCOUNTER — Other Ambulatory Visit: Payer: Self-pay | Admitting: Internal Medicine

## 2023-11-28 ENCOUNTER — Encounter: Payer: Self-pay | Admitting: Internal Medicine

## 2023-11-28 DIAGNOSIS — Z419 Encounter for procedure for purposes other than remedying health state, unspecified: Secondary | ICD-10-CM | POA: Diagnosis not present

## 2023-11-28 DIAGNOSIS — E1140 Type 2 diabetes mellitus with diabetic neuropathy, unspecified: Secondary | ICD-10-CM

## 2023-12-03 ENCOUNTER — Ambulatory Visit (INDEPENDENT_AMBULATORY_CARE_PROVIDER_SITE_OTHER): Payer: Medicaid Other | Admitting: Internal Medicine

## 2023-12-03 ENCOUNTER — Encounter: Payer: Self-pay | Admitting: Internal Medicine

## 2023-12-03 VITALS — BP 133/80 | HR 99 | Ht 67.5 in | Wt 328.8 lb

## 2023-12-03 DIAGNOSIS — N898 Other specified noninflammatory disorders of vagina: Secondary | ICD-10-CM | POA: Diagnosis not present

## 2023-12-03 DIAGNOSIS — Z1211 Encounter for screening for malignant neoplasm of colon: Secondary | ICD-10-CM

## 2023-12-03 DIAGNOSIS — F3341 Major depressive disorder, recurrent, in partial remission: Secondary | ICD-10-CM | POA: Diagnosis not present

## 2023-12-03 DIAGNOSIS — E1140 Type 2 diabetes mellitus with diabetic neuropathy, unspecified: Secondary | ICD-10-CM

## 2023-12-03 DIAGNOSIS — Z7984 Long term (current) use of oral hypoglycemic drugs: Secondary | ICD-10-CM | POA: Diagnosis not present

## 2023-12-03 DIAGNOSIS — B3731 Acute candidiasis of vulva and vagina: Secondary | ICD-10-CM | POA: Diagnosis not present

## 2023-12-03 MED ORDER — CARIPRAZINE HCL 1.5 MG PO CAPS: 1.5000 mg | ORAL_CAPSULE | Freq: Every day | ORAL | 2 refills | Status: DC

## 2023-12-03 MED ORDER — TIRZEPATIDE 2.5 MG/0.5ML ~~LOC~~ SOAJ: 2.5000 mg | SUBCUTANEOUS | 0 refills | Status: DC

## 2023-12-03 MED ORDER — FLUCONAZOLE 150 MG PO TABS: 150.0000 mg | ORAL_TABLET | Freq: Once | ORAL | 0 refills | Status: AC

## 2023-12-03 NOTE — Progress Notes (Signed)
Established Patient Office Visit  Subjective   Patient ID: Nicole Alvarado, female    DOB: 1975-12-31  Age: 47 y.o. MRN: [id number]  Chief Complaint  Patient presents with   Diabetes    Follow up    Nicole Alvarado returns to care today for follow-up.  She was last evaluated by me on 7/26.  No medication changes were made at that time and 72-month follow-up was arranged.  In the interim, she recently contacted our office endorsing significant GI discomfort that she attributed to Ozempic.  She has stopped taking it as a result.  Today she reports feeling fairly well.  Her acute concerns are wanting to discuss other medication options for management of diabetes and obesity, she is concerned she has a yeast infection due to vaginal irritation, and lastly she feels that her depression is not adequately treated.  She has had 3 deaths in her family within the last 6 months.  She is currently taking Cymbalta 90 mg daily, which she believes is effective but feels there is room for improvement.  Past Medical History:  Diagnosis Date   Anxiety    Diabetes mellitus without complication (HCC)    Hypertension    Neuropathy    Past Surgical History:  Procedure Laterality Date   CESAREAN SECTION     5   TOTAL ABDOMINAL HYSTERECTOMY     Social History   Tobacco Use   Smoking status: Never    Passive exposure: Never   Smokeless tobacco: Never  Vaping Use   Vaping status: Never Used  Substance Use Topics   Alcohol use: Never   Drug use: Never   Family History  Problem Relation Age of Onset   Heart attack Mother 67   Anxiety disorder Mother    Anxiety disorder Sister    Multiple sclerosis Sister    Allergies  Allergen Reactions   Penicillins Anaphylaxis   Bactrim [Sulfamethoxazole-Trimethoprim] Hives   Sulfa Antibiotics Hives   Keflex [Cephalexin] Rash   Review of Systems  Genitourinary:        Vaginal irritation  Psychiatric/Behavioral:  Positive for depression.   All other systems  reviewed and are negative.    Objective:     BP 133/80 (BP Location: Right Arm, Patient Position: Sitting, Cuff Size: Large)   Pulse 99   Ht 5' 7.5" (1.715 m)   Wt (!) 328 lb 12.8 oz (149.1 kg)   SpO2 98%   BMI 50.74 kg/m  BP Readings from Last 3 Encounters:  12/03/23 133/80  07/23/23 118/82  05/06/23 110/76   Physical Exam Vitals reviewed.  Constitutional:      General: She is not in acute distress.    Appearance: Normal appearance. She is obese. She is not toxic-appearing.  HENT:     Head: Normocephalic and atraumatic.     Right Ear: External ear normal.     Left Ear: External ear normal.     Nose: Nose normal. No congestion or rhinorrhea.     Mouth/Throat:     Mouth: Mucous membranes are moist.     Pharynx: Oropharynx is clear. No oropharyngeal exudate or posterior oropharyngeal erythema.  Eyes:     General: No scleral icterus.    Extraocular Movements: Extraocular movements intact.     Conjunctiva/sclera: Conjunctivae normal.     Pupils: Pupils are equal, round, and reactive to light.  Cardiovascular:     Rate and Rhythm: Normal rate and regular rhythm.     Pulses: Normal pulses.  Heart sounds: Normal heart sounds. No murmur heard.    No friction rub. No gallop.  Pulmonary:     Effort: Pulmonary effort is normal.     Breath sounds: Normal breath sounds. No wheezing, rhonchi or rales.  Abdominal:     General: Abdomen is flat. Bowel sounds are normal. There is no distension.     Palpations: Abdomen is soft.     Tenderness: There is no abdominal tenderness.  Musculoskeletal:        General: No swelling. Normal range of motion.     Cervical back: Normal range of motion.     Right lower leg: No edema.     Left lower leg: No edema.  Lymphadenopathy:     Cervical: No cervical adenopathy.  Skin:    General: Skin is warm and dry.     Capillary Refill: Capillary refill takes less than 2 seconds.     Coloration: Skin is not jaundiced.  Neurological:      General: No focal deficit present.     Mental Status: She is alert and oriented to person, place, and time.  Psychiatric:        Mood and Affect: Mood normal.        Behavior: Behavior normal.   Last CBC Lab Results  Component Value Date   WBC 10.1 05/06/2023   HGB 14.8 05/06/2023   HCT 45.1 05/06/2023   MCV 87.9 05/06/2023   MCH 28.8 05/06/2023   RDW 11.9 05/06/2023   PLT 332 05/06/2023   Last metabolic panel Lab Results  Component Value Date   GLUCOSE 149 (H) 05/06/2023   NA 136 05/06/2023   K 4.1 05/06/2023   CL 101 05/06/2023   CO2 28 05/06/2023   BUN 14 05/06/2023   CREATININE 0.99 05/06/2023   GFRNONAA >60 05/06/2023   CALCIUM 9.1 05/06/2023   PROT 7.1 05/06/2023   ALBUMIN 3.7 05/06/2023   LABGLOB 2.6 02/24/2023   AGRATIO 1.5 02/24/2023   BILITOT 0.6 05/06/2023   ALKPHOS 54 05/06/2023   AST 22 05/06/2023   ALT 28 05/06/2023   ANIONGAP 7 05/06/2023   Last lipids Lab Results  Component Value Date   CHOL 146 07/30/2023   HDL 57 07/30/2023   LDLCALC 74 07/30/2023   TRIG 78 07/30/2023   CHOLHDL 2.6 07/30/2023   Last hemoglobin A1c Lab Results  Component Value Date   HGBA1C 6.4 (H) 07/30/2023   Last thyroid functions Lab Results  Component Value Date   TSH 0.871 02/24/2023   Last vitamin D Lab Results  Component Value Date   VD25OH 31.5 07/30/2023   The 10-year ASCVD risk score (Arnett DK, et al., 2019) is: 1.4%    Assessment & Plan:   Problem List Items Addressed This Visit       Type 2 diabetes mellitus with diabetic neuropathy, without long-term current use of insulin (HCC)    A1c 6.4 on labs from August.  Unfortunately, she has stopped taking Ozempic due to GI side effects.  She remains on metformin XR 500 mg daily and cannot tolerate higher doses due to side effects.  She is concerned that her blood sugar readings have increased since stopping Ozempic and would like to review other medication options. -Through shared decision making,  Mounjaro 2.5 mg weekly has been prescribed today.  Follow-up in 3 months for reassessment and repeat A1c.      Candida vaginitis    She endorses vaginal irritation similar to what she has previously experienced  when she has had a yeast infection.  Diflucan 150 mg once prescribed today for treatment. Will check UA to rule out UTI.      Recurrent major depressive disorder, in partial remission (HCC) - Primary    She is currently prescribed Cymbalta 90 mg daily.  PHQ-9 score remains elevated today at 13.  She feels that Cymbalta is effective but also that there is room for improvement.  This is partially due to losing 3 family members within the last 3 months.  She reports previously trying BuSpar without significant improvement. -Add Vraylar 1.5 mg daily.  Continue Cymbalta as currently prescribed.      Colon cancer screening    Previously referred to gastroenterology for screening colonoscopy but the referral was closed after she did not return a questionnaire within 6 months.  It appears that the questionnaire was sent to the wrong address.  Her information has been updated and a new gastroenterology referral has been placed today.      Return in about 3 months (around 03/02/2024).   Billie Lade, MD

## 2023-12-03 NOTE — Assessment & Plan Note (Signed)
She endorses vaginal irritation similar to what she has previously experienced when she has had a yeast infection.  Diflucan 150 mg once prescribed today for treatment. Will check UA to rule out UTI.

## 2023-12-03 NOTE — Assessment & Plan Note (Signed)
A1c 6.4 on labs from August.  Unfortunately, she has stopped taking Ozempic due to GI side effects.  She remains on metformin XR 500 mg daily and cannot tolerate higher doses due to side effects.  She is concerned that her blood sugar readings have increased since stopping Ozempic and would like to review other medication options. -Through shared decision making, Mounjaro 2.5 mg weekly has been prescribed today.  Follow-up in 3 months for reassessment and repeat A1c.

## 2023-12-03 NOTE — Assessment & Plan Note (Signed)
She is currently prescribed Cymbalta 90 mg daily.  PHQ-9 score remains elevated today at 13.  She feels that Cymbalta is effective but also that there is room for improvement.  This is partially due to losing 3 family members within the last 3 months.  She reports previously trying BuSpar without significant improvement. -Add Vraylar 1.5 mg daily.  Continue Cymbalta as currently prescribed.

## 2023-12-03 NOTE — Assessment & Plan Note (Signed)
Previously referred to gastroenterology for screening colonoscopy but the referral was closed after she did not return a questionnaire within 6 months.  It appears that the questionnaire was sent to the wrong address.  Her information has been updated and a new gastroenterology referral has been placed today.

## 2023-12-03 NOTE — Telephone Encounter (Signed)
PA submitted.

## 2023-12-03 NOTE — Patient Instructions (Signed)
It was a pleasure to see you today.  Thank you for giving [place] the opportunity to be involved in your care.  Below is a brief recap of your visit and next steps.  We will plan to see you again in 3 months.  Summary Start Mounjaro 2.5 mg weekly Start Vraylar as an adjunct to Cymbalta Diflucan for yeast infection Follow up in 3 months

## 2023-12-06 ENCOUNTER — Encounter: Payer: Self-pay | Admitting: Internal Medicine

## 2023-12-06 ENCOUNTER — Telehealth: Payer: Self-pay | Admitting: Internal Medicine

## 2023-12-06 NOTE — Telephone Encounter (Signed)
Patient is calling back concerning medication that needs to be authorized . Patient is saying blood sugar is getting out of control . The medication she is needing asap. Please contact patient

## 2023-12-06 NOTE — Telephone Encounter (Signed)
The mounjaro was denied. States she must try formulary alternatives first- ozempic, victoza- insurance denial with more info is in your review folder at nurses station

## 2023-12-06 NOTE — Telephone Encounter (Signed)
Pt called back to office wants to check status on pre auth for monjaro ,   Previous [employer] / tele messages

## 2023-12-07 ENCOUNTER — Telehealth: Payer: Self-pay

## 2023-12-07 ENCOUNTER — Other Ambulatory Visit: Payer: Self-pay | Admitting: Internal Medicine

## 2023-12-07 DIAGNOSIS — E1140 Type 2 diabetes mellitus with diabetic neuropathy, unspecified: Secondary | ICD-10-CM

## 2023-12-07 MED ORDER — TRULICITY 0.75 MG/0.5ML ~~LOC~~ SOAJ: 0.7500 mg | SUBCUTANEOUS | 0 refills | Status: DC

## 2023-12-07 NOTE — Telephone Encounter (Signed)
PA denied, inquiring with Dr Durwin Nora on what to do now

## 2023-12-07 NOTE — Telephone Encounter (Signed)
PA submitted.

## 2023-12-07 NOTE — Telephone Encounter (Signed)
Copied from CRM [id number]. Topic: Clinical - Medication Question >> Dec 03, 2023  4:16 PM Nicole Alvarado wrote: Reason for CRM: Pt called stating the pharmacy told her it can takes weeks to get PA for tirzepatide [facility]) 2.5 MG/0.5ML Pen and she would like an alternative medications called in to [id number] - [place], [place] - [address]

## 2023-12-07 NOTE — Telephone Encounter (Signed)
Patient called back in stating that the trulicity needs a pa and dr dixion needs to call the insurance company

## 2023-12-07 NOTE — Telephone Encounter (Signed)
PA denial reviewed. Requires failure of 2 preferred options. She has failed Ozempic. Will try Trulicity. A prescription was sent to her pharmacy.

## 2023-12-08 ENCOUNTER — Encounter: Payer: Self-pay | Admitting: Internal Medicine

## 2023-12-08 LAB — UA/M W/RFLX CULTURE, ROUTINE
Bilirubin, UA: NEGATIVE
Ketones, UA: NEGATIVE
Nitrite, UA: NEGATIVE
Protein,UA: NEGATIVE
RBC, UA: NEGATIVE
Specific Gravity, UA: 1.028 (ref 1.005–1.030)
Urobilinogen, Ur: 0.2 mg/dL (ref 0.2–1.0)
pH, UA: 5.5 (ref 5.0–7.5)

## 2023-12-08 LAB — MICROSCOPIC EXAMINATION
Bacteria, UA: NONE SEEN
Casts: NONE SEEN /LPF
Epithelial Cells (non renal): NONE SEEN /HPF (ref 0–10)
RBC, Urine: NONE SEEN /HPF (ref 0–2)

## 2023-12-08 LAB — URINE CULTURE, REFLEX

## 2023-12-08 MED ORDER — NITROFURANTOIN MONOHYD MACRO 100 MG PO CAPS: 100.0000 mg | ORAL_CAPSULE | Freq: Two times a day (BID) | ORAL | 0 refills | Status: DC

## 2023-12-08 MED ORDER — NITROFURANTOIN MONOHYD MACRO 100 MG PO CAPS: 100.0000 mg | ORAL_CAPSULE | Freq: Two times a day (BID) | ORAL | 0 refills | Status: AC

## 2023-12-08 NOTE — Telephone Encounter (Signed)
Trulicity prescribed as recommended by insurance and is approved

## 2023-12-09 ENCOUNTER — Other Ambulatory Visit: Payer: Self-pay | Admitting: Physician Assistant

## 2023-12-09 ENCOUNTER — Other Ambulatory Visit: Payer: Self-pay | Admitting: Internal Medicine

## 2023-12-09 DIAGNOSIS — F3341 Major depressive disorder, recurrent, in partial remission: Secondary | ICD-10-CM

## 2023-12-09 DIAGNOSIS — K047 Periapical abscess without sinus: Secondary | ICD-10-CM

## 2023-12-09 MED ORDER — DULOXETINE HCL 30 MG PO CPEP: 30.0000 mg | ORAL_CAPSULE | Freq: Every day | ORAL | 0 refills | Status: DC

## 2023-12-09 MED ORDER — DULOXETINE HCL 60 MG PO CPEP: 60.0000 mg | ORAL_CAPSULE | Freq: Every day | ORAL | 0 refills | Status: DC

## 2023-12-29 ENCOUNTER — Other Ambulatory Visit: Payer: Self-pay | Admitting: Internal Medicine

## 2023-12-29 DIAGNOSIS — Z419 Encounter for procedure for purposes other than remedying health state, unspecified: Secondary | ICD-10-CM | POA: Diagnosis not present

## 2023-12-29 DIAGNOSIS — E1140 Type 2 diabetes mellitus with diabetic neuropathy, unspecified: Secondary | ICD-10-CM

## 2024-01-12 ENCOUNTER — Other Ambulatory Visit: Payer: Self-pay | Admitting: Internal Medicine

## 2024-01-12 DIAGNOSIS — F3341 Major depressive disorder, recurrent, in partial remission: Secondary | ICD-10-CM

## 2024-01-17 ENCOUNTER — Other Ambulatory Visit: Payer: Self-pay | Admitting: Internal Medicine

## 2024-01-17 DIAGNOSIS — E1140 Type 2 diabetes mellitus with diabetic neuropathy, unspecified: Secondary | ICD-10-CM

## 2024-01-17 DIAGNOSIS — F3341 Major depressive disorder, recurrent, in partial remission: Secondary | ICD-10-CM

## 2024-01-17 DIAGNOSIS — I10 Essential (primary) hypertension: Secondary | ICD-10-CM

## 2024-01-17 MED ORDER — OLMESARTAN MEDOXOMIL 20 MG PO TABS: 20.0000 mg | ORAL_TABLET | Freq: Every day | ORAL | 0 refills | Status: DC

## 2024-01-17 MED ORDER — TRULICITY 1.5 MG/0.5ML ~~LOC~~ SOAJ: 1.5000 mg | SUBCUTANEOUS | 0 refills | Status: AC

## 2024-01-29 ENCOUNTER — Other Ambulatory Visit: Payer: Self-pay | Admitting: Internal Medicine

## 2024-01-29 DIAGNOSIS — Z419 Encounter for procedure for purposes other than remedying health state, unspecified: Secondary | ICD-10-CM | POA: Diagnosis not present

## 2024-01-29 DIAGNOSIS — F3341 Major depressive disorder, recurrent, in partial remission: Secondary | ICD-10-CM

## 2024-01-31 ENCOUNTER — Other Ambulatory Visit: Payer: Self-pay

## 2024-01-31 DIAGNOSIS — E1140 Type 2 diabetes mellitus with diabetic neuropathy, unspecified: Secondary | ICD-10-CM

## 2024-01-31 MED ORDER — METFORMIN HCL ER 500 MG PO TB24: 500.0000 mg | ORAL_TABLET | Freq: Every day | ORAL | 3 refills | Status: DC

## 2024-02-23 ENCOUNTER — Telehealth: Payer: Medicaid Other | Admitting: Physician Assistant

## 2024-02-23 ENCOUNTER — Other Ambulatory Visit: Payer: Self-pay | Admitting: Internal Medicine

## 2024-02-23 DIAGNOSIS — R3989 Other symptoms and signs involving the genitourinary system: Secondary | ICD-10-CM | POA: Diagnosis not present

## 2024-02-23 DIAGNOSIS — B379 Candidiasis, unspecified: Secondary | ICD-10-CM

## 2024-02-23 DIAGNOSIS — T3695XA Adverse effect of unspecified systemic antibiotic, initial encounter: Secondary | ICD-10-CM

## 2024-02-23 DIAGNOSIS — F3341 Major depressive disorder, recurrent, in partial remission: Secondary | ICD-10-CM

## 2024-02-23 MED ORDER — DULOXETINE HCL 60 MG PO CPEP: 60.0000 mg | ORAL_CAPSULE | Freq: Every day | ORAL | 0 refills | Status: DC

## 2024-02-23 MED ORDER — FLUCONAZOLE 150 MG PO TABS: 150.0000 mg | ORAL_TABLET | ORAL | 0 refills | Status: DC | PRN

## 2024-02-23 MED ORDER — DULOXETINE HCL 30 MG PO CPEP
30.0000 mg | ORAL_CAPSULE | Freq: Every day | ORAL | 0 refills | Status: DC
Start: 2024-02-23 — End: 2024-03-22

## 2024-02-23 MED ORDER — NITROFURANTOIN MONOHYD MACRO 100 MG PO CAPS: 100.0000 mg | ORAL_CAPSULE | Freq: Two times a day (BID) | ORAL | 0 refills | Status: DC

## 2024-02-23 NOTE — Progress Notes (Signed)
 E-Visit for Urinary Problems  We are sorry that you are not feeling well.  Here is how we plan to help!  Based on what you shared with me it looks like you most likely have a simple urinary tract infection.  A UTI (Urinary Tract Infection) is a bacterial infection of the bladder.  Most cases of urinary tract infections are simple to treat but a key part of your care is to encourage you to drink plenty of fluids and watch your symptoms carefully.  I have prescribed MacroBid 100 mg twice a day for 5 days.  Your symptoms should gradually improve. Call us if the burning in your urine worsens, you develop worsening fever, back pain or pelvic pain or if your symptoms do not resolve after completing the antibiotic.  Urinary tract infections can be prevented by drinking plenty of water to keep your body hydrated.  Also be sure when you wipe, wipe from front to back and don't hold it in!  If possible, empty your bladder every 4 hours.  Diflucan given as prophylaxis as patient tends to get vaginal yeast infections with antibiotic use.   HOME CARE Drink plenty of fluids Compete the full course of the antibiotics even if the symptoms resolve Remember, when you need to go.go. Holding in your urine can increase the likelihood of getting a UTI! GET HELP RIGHT AWAY IF: You cannot urinate You get a high fever Worsening back pain occurs You see blood in your urine You feel sick to your stomach or throw up You feel like you are going to pass out  MAKE SURE YOU  Understand these instructions. Will watch your condition. Will get help right away if you are not doing well or get worse.   Thank you for choosing an e-visit.  Your e-visit answers were reviewed by a board certified advanced clinical practitioner to complete your personal care plan. Depending upon the condition, your plan could have included both over the counter or prescription medications.  Please review your pharmacy choice. Make sure  the pharmacy is open so you can pick up prescription now. If there is a problem, you may contact your provider through [employer] and have the prescription routed to another pharmacy.  Your safety is important to [place]. If you have drug allergies check your prescription carefully.   For the next 24 hours you can use MyChart to ask questions about today's visit, request a non-urgent call back, or ask for a work or school excuse. You will get an email in the next two days asking about your experience. I hope that your e-visit has been valuable and will speed your recovery.   I have spent 5 minutes in review of e-visit questionnaire, review and updating patient chart, medical decision making and response to patient.   Margaretann Loveless, PA-C

## 2024-02-24 ENCOUNTER — Encounter: Payer: Self-pay | Admitting: Internal Medicine

## 2024-02-24 DIAGNOSIS — E1140 Type 2 diabetes mellitus with diabetic neuropathy, unspecified: Secondary | ICD-10-CM

## 2024-02-26 DIAGNOSIS — Z419 Encounter for procedure for purposes other than remedying health state, unspecified: Secondary | ICD-10-CM | POA: Diagnosis not present

## 2024-02-28 MED ORDER — OZEMPIC (0.25 OR 0.5 MG/DOSE) 2 MG/3ML ~~LOC~~ SOPN: 0.2500 mg | PEN_INJECTOR | SUBCUTANEOUS | 0 refills | Status: DC

## 2024-02-29 ENCOUNTER — Telehealth: Payer: Self-pay | Admitting: Internal Medicine

## 2024-02-29 ENCOUNTER — Other Ambulatory Visit (HOSPITAL_COMMUNITY): Payer: Self-pay

## 2024-02-29 ENCOUNTER — Telehealth: Payer: Self-pay | Admitting: Pharmacy Technician

## 2024-02-29 NOTE — Telephone Encounter (Signed)
 Pharmacy Patient Advocate Encounter  Received notification from [facility] Medicaid that Prior Authorization for [facility] 0.25MG /0.5ML PEN has been APPROVED from 02/29/2024 to 02/28/2025. Ran test claim, Copay is $4.00. This test claim was processed through [facility]- copay amounts may vary at other pharmacies due to pharmacy/plan contracts, or as the patient moves through the different stages of their insurance plan.   PA #/Case ID/Reference #: [id number]

## 2024-02-29 NOTE — Telephone Encounter (Signed)
 PA request has been Received. New Encounter has been or will be created for follow up. For additional info see Pharmacy Prior Auth telephone encounter from 02/29/2024.

## 2024-02-29 NOTE — Telephone Encounter (Signed)
 Pharmacy Patient Advocate Encounter   Received notification from Pt Calls Messages that prior authorization for OZEMPIC .02MG  OR 0.5MG /DOSE is required/requested.   Insurance verification completed.   The patient is insured through [facility] [place] .   Per test claim: PA required; PA started via CoverMyMeds. KEY [id number] . Waiting for clinical questions to populate.

## 2024-02-29 NOTE — Telephone Encounter (Signed)
 Pharmacy Patient Advocate Encounter   Received notification from Pt Calls Messages that prior authorization for [facility] 0.25MG  OR 0.5MG /DOSE is required/requested.   Insurance verification completed.   The patient is insured through [facility] [place] .   Per test claim: PA required; PA submitted to above mentioned insurance via CoverMyMeds Key/confirmation #/EOC [facility] Status is pending

## 2024-02-29 NOTE — Telephone Encounter (Signed)
 Copied from CRM [id number]. Topic: General - Other >> Feb 29, 2024 12:18 PM Kristie Cowman wrote: Reason for CRM: The patient needs a preauthorization for her Ozempic before they will fill it for her.    Kriste Basque [phone]

## 2024-03-02 ENCOUNTER — Ambulatory Visit: Payer: Medicaid Other | Admitting: Internal Medicine

## 2024-03-02 ENCOUNTER — Encounter: Payer: Self-pay | Admitting: Internal Medicine

## 2024-03-14 ENCOUNTER — Ambulatory Visit (INDEPENDENT_AMBULATORY_CARE_PROVIDER_SITE_OTHER): Admitting: Internal Medicine

## 2024-03-14 ENCOUNTER — Telehealth: Payer: Self-pay | Admitting: Pharmacy Technician

## 2024-03-14 ENCOUNTER — Telehealth: Payer: Self-pay

## 2024-03-14 ENCOUNTER — Other Ambulatory Visit (HOSPITAL_COMMUNITY): Payer: Self-pay

## 2024-03-14 VITALS — BP 129/83 | HR 89 | Ht 67.0 in | Wt 333.8 lb

## 2024-03-14 DIAGNOSIS — E1169 Type 2 diabetes mellitus with other specified complication: Secondary | ICD-10-CM

## 2024-03-14 DIAGNOSIS — E1140 Type 2 diabetes mellitus with diabetic neuropathy, unspecified: Secondary | ICD-10-CM

## 2024-03-14 DIAGNOSIS — F3341 Major depressive disorder, recurrent, in partial remission: Secondary | ICD-10-CM | POA: Diagnosis not present

## 2024-03-14 DIAGNOSIS — I10 Essential (primary) hypertension: Secondary | ICD-10-CM | POA: Diagnosis not present

## 2024-03-14 DIAGNOSIS — E785 Hyperlipidemia, unspecified: Secondary | ICD-10-CM

## 2024-03-14 DIAGNOSIS — Z1211 Encounter for screening for malignant neoplasm of colon: Secondary | ICD-10-CM

## 2024-03-14 DIAGNOSIS — E559 Vitamin D deficiency, unspecified: Secondary | ICD-10-CM | POA: Diagnosis not present

## 2024-03-14 DIAGNOSIS — Z7984 Long term (current) use of oral hypoglycemic drugs: Secondary | ICD-10-CM | POA: Diagnosis not present

## 2024-03-14 MED ORDER — BLOOD GLUCOSE MONITOR KIT: PACK | 0 refills | Status: DC

## 2024-03-14 MED ORDER — ACCU-CHEK GUIDE ME W/DEVICE KIT
1.0000 | PACK | Freq: Four times a day (QID) | 0 refills | Status: DC
Start: 2024-03-14 — End: 2024-04-05

## 2024-03-14 MED ORDER — FREESTYLE LIBRE 3 SENSOR MISC: 1.0000 | 3 refills | Status: AC

## 2024-03-14 MED ORDER — TIRZEPATIDE 2.5 MG/0.5ML ~~LOC~~ SOAJ: 2.5000 mg | SUBCUTANEOUS | 0 refills | Status: DC

## 2024-03-14 MED ORDER — FREESTYLE LIBRE 3 READER DEVI: 1.0000 | 0 refills | Status: AC

## 2024-03-14 MED ORDER — CARIPRAZINE HCL 3 MG PO CAPS: 3.0000 mg | ORAL_CAPSULE | Freq: Every day | ORAL | 2 refills | Status: DC

## 2024-03-14 NOTE — Telephone Encounter (Signed)
 Pharmacy Patient Advocate Encounter  Received notification from [facility] Medicaid that Prior Authorization for [facility] 2.5MG /ML AUTO-INJECTORS as been APPROVED from 03/14/2024 to 03/14/2025   PA #/Case ID/Reference #: [id number]

## 2024-03-14 NOTE — Telephone Encounter (Signed)
 Pharmacy Patient Advocate Encounter   Received notification from CoverMyMeds that prior authorization for [facility] 2.5MG /0.5ML AUTO-INJECTORS is required/requested.   Insurance verification completed.   The patient is insured through [facility] [place] .   Per test claim: PA required; PA submitted to above mentioned insurance via CoverMyMeds Key/confirmation #/[id number] Status is pending

## 2024-03-14 NOTE — Telephone Encounter (Signed)
 PA request has been Received. New Encounter has been or will be created for follow up. For additional info see Pharmacy Prior Auth telephone encounter from 03/14/2024.

## 2024-03-14 NOTE — Assessment & Plan Note (Signed)
 Cologuard ordered today

## 2024-03-14 NOTE — Progress Notes (Signed)
 Established Patient Office Visit  Subjective   Patient ID: Nicole Alvarado, female    DOB: 06/08/1976  Age: 49 y.o. MRN: [id number]  Chief Complaint  Patient presents with   Care Management    Pt. Requesting a new handi-cap placker, and a refill on Vraylar    [name] returns to care today for routine follow-up.  She was last evaluated by me in December 2024.  Vraylar was added at that time in the setting of poorly controlled depression.  Greggory Keen was initially prescribed in the setting of T2DM.  Ultimately, she was started on Trulicity.  There have been no acute interval events.  Today she states that her mood has improved with Vraylar but she continues to feel depressed.  She is interested in further increasing the dose.  She stopped taking Trulicity due to intolerable GI side effects and has resumed Ozempic.  Unfortunately, she continues to experience GI side effects with Ozempic and would like to look at other medication options.  Past Medical History:  Diagnosis Date   Anxiety    Diabetes mellitus without complication (HCC)    Hypertension    Neuropathy    Past Surgical History:  Procedure Laterality Date   CESAREAN SECTION     5   TOTAL ABDOMINAL HYSTERECTOMY     Social History   Tobacco Use   Smoking status: Never    Passive exposure: Never   Smokeless tobacco: Never  Vaping Use   Vaping status: Never Used  Substance Use Topics   Alcohol use: Never   Drug use: Never   Family History  Problem Relation Age of Onset   Heart attack Mother 27   Anxiety disorder Mother    Anxiety disorder Sister    Multiple sclerosis Sister    Allergies  Allergen Reactions   Penicillins Anaphylaxis   Bactrim [Sulfamethoxazole-Trimethoprim] Hives   Sulfa Antibiotics Hives   Keflex [Cephalexin] Rash   Review of Systems  Constitutional:  Negative for chills and fever.  HENT:  Negative for sore throat.   Respiratory:  Negative for cough and shortness of breath.    Cardiovascular:  Negative for chest pain, palpitations and leg swelling.  Gastrointestinal:  Negative for abdominal pain, blood in stool, constipation, diarrhea, nausea and vomiting.  Genitourinary:  Negative for dysuria and hematuria.  Musculoskeletal:  Negative for myalgias.  Skin:  Negative for itching and rash.  Neurological:  Negative for dizziness and headaches.  Psychiatric/Behavioral:  Positive for depression. Negative for suicidal ideas.      Objective:     BP 129/83 (BP Location: Right Arm, Patient Position: Sitting, Cuff Size: Large)   Pulse 89   Ht 5\' 7"  (1.702 m)   Wt (!) 333 lb 12.8 oz (151.4 kg)   SpO2 94%   BMI 52.28 kg/m  BP Readings from Last 3 Encounters:  03/14/24 129/83  12/03/23 133/80  07/23/23 118/82   Physical Exam Vitals reviewed.  Constitutional:      General: She is not in acute distress.    Appearance: Normal appearance. She is obese. She is not toxic-appearing.  HENT:     Head: Normocephalic and atraumatic.     Right Ear: External ear normal.     Left Ear: External ear normal.     Nose: Nose normal. No congestion or rhinorrhea.     Mouth/Throat:     Mouth: Mucous membranes are moist.     Pharynx: Oropharynx is clear. No oropharyngeal exudate or posterior oropharyngeal erythema.  Eyes:     General: No scleral icterus.    Extraocular Movements: Extraocular movements intact.     Conjunctiva/sclera: Conjunctivae normal.     Pupils: Pupils are equal, round, and reactive to light.  Cardiovascular:     Rate and Rhythm: Normal rate and regular rhythm.     Pulses: Normal pulses.     Heart sounds: Normal heart sounds. No murmur heard.    No friction rub. No gallop.  Pulmonary:     Effort: Pulmonary effort is normal.     Breath sounds: Normal breath sounds. No wheezing, rhonchi or rales.  Abdominal:     General: Abdomen is flat. Bowel sounds are normal. There is no distension.     Palpations: Abdomen is soft.     Tenderness: There is no  abdominal tenderness.  Musculoskeletal:        General: No swelling. Normal range of motion.     Cervical back: Normal range of motion.     Right lower leg: No edema.     Left lower leg: No edema.  Lymphadenopathy:     Cervical: No cervical adenopathy.  Skin:    General: Skin is warm and dry.     Capillary Refill: Capillary refill takes less than 2 seconds.     Coloration: Skin is not jaundiced.  Neurological:     General: No focal deficit present.     Mental Status: She is alert and oriented to person, place, and time.  Psychiatric:        Mood and Affect: Mood normal.        Behavior: Behavior normal.   Last CBC Lab Results  Component Value Date   WBC 10.1 05/06/2023   HGB 14.8 05/06/2023   HCT 45.1 05/06/2023   MCV 87.9 05/06/2023   MCH 28.8 05/06/2023   RDW 11.9 05/06/2023   PLT 332 05/06/2023   Last metabolic panel Lab Results  Component Value Date   GLUCOSE 149 (H) 05/06/2023   NA 136 05/06/2023   K 4.1 05/06/2023   CL 101 05/06/2023   CO2 28 05/06/2023   BUN 14 05/06/2023   CREATININE 0.99 05/06/2023   GFRNONAA >60 05/06/2023   CALCIUM 9.1 05/06/2023   PROT 7.1 05/06/2023   ALBUMIN 3.7 05/06/2023   LABGLOB 2.6 02/24/2023   AGRATIO 1.5 02/24/2023   BILITOT 0.6 05/06/2023   ALKPHOS 54 05/06/2023   AST 22 05/06/2023   ALT 28 05/06/2023   ANIONGAP 7 05/06/2023   Last lipids Lab Results  Component Value Date   CHOL 146 07/30/2023   HDL 57 07/30/2023   LDLCALC 74 07/30/2023   TRIG 78 07/30/2023   CHOLHDL 2.6 07/30/2023   Last hemoglobin A1c Lab Results  Component Value Date   HGBA1C 6.4 (H) 07/30/2023   Last thyroid functions Lab Results  Component Value Date   TSH 0.871 02/24/2023   Last vitamin D Lab Results  Component Value Date   VD25OH 31.5 07/30/2023   The 10-year ASCVD risk score (Arnett DK, et al., 2019) is: 1.3%    Assessment & Plan:   Problem List Items Addressed This Visit       HTN (hypertension) - Primary   Remains  adequately controlled on current antihypertensive regimen.  No medication changes are indicated today.      Type 2 diabetes mellitus with diabetic neuropathy, without long-term current use of insulin (HCC)   A1c 6.4 on labs from August 2024.  She is currently prescribed metformin XR 500  mg daily.  Most recently, she was started on Trulicity due to GI side effects that she experienced with Ozempic.  She experienced a lot of side effects with Trulicity and has returned to taking Ozempic.  She continues to have intolerable GI side effects and would like to consider other treatment options. -Through shared decision-making, Mounjaro 2.5 mg weekly has been prescribed today.  Continue metformin XR as currently prescribed. -Repeat A1c and urine microalbumin/creatinine ratio ordered today -Patient is interested in a continuous glucose monitor.  Freestyle libre 3 device and sensor ordered today.      Recurrent major depressive disorder, in partial remission (HCC)   She is currently prescribed Cymbalta 90 mg daily and Vraylar 1.5 mg daily.  Vraylar was added at her last appointment as she felt that Cymbalta was partially effective but there was room for improvement.  Leafy Kindle has been effective, however she continues to feel depressed and would like to increase Vraylar.  Denies SI/HI. -Increase Vraylar to 3 mg daily.  Continue Cymbalta 90 mg daily.      Colon cancer screening   Cologuard ordered today       Return in about 3 months (around 06/14/2024).    Billie Lade, MD

## 2024-03-14 NOTE — Assessment & Plan Note (Signed)
 She is currently prescribed Cymbalta 90 mg daily and Vraylar 1.5 mg daily.  Vraylar was added at her last appointment as she felt that Cymbalta was partially effective but there was room for improvement.  Nicole Alvarado has been effective, however she continues to feel depressed and would like to increase Vraylar.  Denies SI/HI. -Increase Vraylar to 3 mg daily.  Continue Cymbalta 90 mg daily.

## 2024-03-14 NOTE — Telephone Encounter (Signed)
 Copied from CRM [id number]. Topic: Clinical - Medication Question >> Mar 14, 2024 11:43 AM Haroldine Laws wrote: Reason for CRM: pt called saying the insurance told her today that there needs to be PA's on the [facility] and the freestyle sensor.

## 2024-03-14 NOTE — Assessment & Plan Note (Signed)
 Remains adequately controlled on current antihypertensive regimen.  No medication changes are indicated today.

## 2024-03-14 NOTE — Assessment & Plan Note (Addendum)
 A1c 6.4 on labs from August 2024.  She is currently prescribed metformin XR 500 mg daily.  Most recently, she was started on Trulicity due to GI side effects that she experienced with Ozempic.  She experienced a lot of side effects with Trulicity and has returned to taking Ozempic.  She continues to have intolerable GI side effects and would like to consider other treatment options. -Through shared decision-making, Mounjaro 2.5 mg weekly has been prescribed today.  Continue metformin XR as currently prescribed. -Repeat A1c and urine microalbumin/creatinine ratio ordered today -Patient is interested in a continuous glucose monitor.  Freestyle libre 3 device and sensor ordered today.

## 2024-03-14 NOTE — Patient Instructions (Signed)
 It was a pleasure to see you today.  Thank you for giving [place] the opportunity to be involved in your care.  Below is a brief recap of your visit and next steps.  We will plan to see you again in 3 months.  Summary Mounjaro prescribed today Increase Vraylar to 3 mg daily Freestyle libre ordered Repeat labs Follow up in 3 months

## 2024-03-14 NOTE — Telephone Encounter (Signed)
 Pharmacy Patient Advocate Encounter   Received notification from Patient Pharmacy that prior authorization for FREESTYLE LIBRE 3 READER AND SENSORS is required/requested.   Insurance verification completed.   The patient is insured through [facility] [place] .   Patient has to have a diagnosis if insulin-dependent diabetes to qualify for a CGM. Please consider prescribing patient an Accu-check Guide meter, strips, and lancets for coverage.

## 2024-03-15 ENCOUNTER — Encounter: Payer: Self-pay | Admitting: Internal Medicine

## 2024-03-16 LAB — LIPID PANEL
Chol/HDL Ratio: 2.9 ratio (ref 0.0–4.4)
Cholesterol, Total: 157 mg/dL (ref 100–199)
HDL: 54 mg/dL (ref >39)
LDL Chol Calc (NIH): 85 mg/dL (ref 0–99)
Triglycerides: 96 mg/dL (ref 0–149)
VLDL Cholesterol Cal: 18 mg/dL (ref 5–40)

## 2024-03-16 LAB — CMP14+EGFR
ALT: 37 IU/L — ABNORMAL HIGH (ref 0–32)
AST: 23 IU/L (ref 0–40)
Albumin: 4.2 g/dL (ref 3.9–4.9)
Alkaline Phosphatase: 82 IU/L (ref 44–121)
BUN/Creatinine Ratio: 17 (ref 9–23)
BUN: 17 mg/dL (ref 6–24)
Bilirubin Total: 0.4 mg/dL (ref 0.0–1.2)
CO2: 23 mmol/L (ref 20–29)
Calcium: 9.6 mg/dL (ref 8.7–10.2)
Chloride: 98 mmol/L (ref 96–106)
Creatinine, Ser: 1.03 mg/dL — ABNORMAL HIGH (ref 0.57–1.00)
Globulin, Total: 3 g/dL (ref 1.5–4.5)
Glucose: 291 mg/dL — ABNORMAL HIGH (ref 70–99)
Potassium: 4.6 mmol/L (ref 3.5–5.2)
Sodium: 141 mmol/L (ref 134–144)
Total Protein: 7.2 g/dL (ref 6.0–8.5)
eGFR: 68 mL/min/{1.73_m2} (ref >59)

## 2024-03-16 LAB — CBC WITH DIFFERENTIAL/PLATELET
Basophils Absolute: 0.1 10*3/uL (ref 0.0–0.2)
Basos: 1 %
EOS (ABSOLUTE): 0.1 10*3/uL (ref 0.0–0.4)
Eos: 2 %
Hematocrit: 48.8 % — ABNORMAL HIGH (ref 34.0–46.6)
Hemoglobin: 16.1 g/dL — ABNORMAL HIGH (ref 11.1–15.9)
Immature Grans (Abs): 0 10*3/uL (ref 0.0–0.1)
Immature Granulocytes: 0 %
Lymphocytes Absolute: 2.7 10*3/uL (ref 0.7–3.1)
Lymphs: 30 %
MCH: 29.7 pg (ref 26.6–33.0)
MCHC: 33 g/dL (ref 31.5–35.7)
MCV: 90 fL (ref 79–97)
Monocytes Absolute: 0.6 10*3/uL (ref 0.1–0.9)
Monocytes: 6 %
Neutrophils Absolute: 5.7 10*3/uL (ref 1.4–7.0)
Neutrophils: 61 %
Platelets: 355 10*3/uL (ref 150–450)
RBC: 5.43 x10E6/uL — ABNORMAL HIGH (ref 3.77–5.28)
RDW: 11.8 % (ref 11.7–15.4)
WBC: 9.2 10*3/uL (ref 3.4–10.8)

## 2024-03-16 LAB — MICROALBUMIN / CREATININE URINE RATIO
Creatinine, Urine: 222.6 mg/dL
Microalb/Creat Ratio: 6 mg/g{creat} (ref 0–29)
Microalbumin, Urine: 14 ug/mL

## 2024-03-16 LAB — HEMOGLOBIN A1C
Est. average glucose Bld gHb Est-mCnc: 269 mg/dL
Hgb A1c MFr Bld: 11 % — ABNORMAL HIGH (ref 4.8–5.6)

## 2024-03-16 LAB — TSH+FREE T4
Free T4: 1.2 ng/dL (ref 0.82–1.77)
TSH: 1.4 u[IU]/mL (ref 0.450–4.500)

## 2024-03-16 LAB — VITAMIN D 25 HYDROXY (VIT D DEFICIENCY, FRACTURES): Vit D, 25-Hydroxy: 23.1 ng/mL — ABNORMAL LOW (ref 30.0–100.0)

## 2024-03-16 LAB — B12 AND FOLATE PANEL
Folate: 7.8 ng/mL (ref >3.0)
Vitamin B-12: 746 pg/mL (ref 232–1245)

## 2024-03-22 ENCOUNTER — Other Ambulatory Visit: Payer: Self-pay | Admitting: Internal Medicine

## 2024-03-22 ENCOUNTER — Telehealth: Payer: Self-pay | Admitting: Internal Medicine

## 2024-03-22 DIAGNOSIS — F3341 Major depressive disorder, recurrent, in partial remission: Secondary | ICD-10-CM

## 2024-03-22 MED ORDER — DULOXETINE HCL 60 MG PO CPEP: 60.0000 mg | ORAL_CAPSULE | Freq: Every day | ORAL | 0 refills | Status: DC

## 2024-03-22 MED ORDER — DULOXETINE HCL 30 MG PO CPEP: 30.0000 mg | ORAL_CAPSULE | Freq: Every day | ORAL | 0 refills | Status: DC

## 2024-03-22 NOTE — Telephone Encounter (Signed)
 Copied from CRM [id number]. Topic: Clinical - Prescription Issue >> Mar 22, 2024  2:09 PM Carlatta H wrote: Reason for CRM:  Received notification from Patient Pharmacy that prior authorization for FREESTYLE LIBRE 3 READER AND SENSORS is required/requested.

## 2024-03-22 NOTE — Telephone Encounter (Signed)
 PA request has been Received. New Encounter has been or will be created for follow up. For additional info see Pharmacy Prior Auth telephone encounter from 03/14/2024.

## 2024-03-25 ENCOUNTER — Emergency Department (HOSPITAL_COMMUNITY)

## 2024-03-25 ENCOUNTER — Encounter (HOSPITAL_COMMUNITY): Payer: Self-pay

## 2024-03-25 ENCOUNTER — Other Ambulatory Visit: Payer: Self-pay

## 2024-03-25 ENCOUNTER — Emergency Department (HOSPITAL_COMMUNITY)
Admission: EM | Admit: 2024-03-25 | Discharge: 2024-03-25 | Disposition: A | Discharge status: Home / Self Care | Attending: Emergency Medicine | Admitting: Emergency Medicine

## 2024-03-25 DIAGNOSIS — I10 Essential (primary) hypertension: Secondary | ICD-10-CM | POA: Insufficient documentation

## 2024-03-25 DIAGNOSIS — W19XXXA Unspecified fall, initial encounter: Secondary | ICD-10-CM | POA: Insufficient documentation

## 2024-03-25 DIAGNOSIS — E119 Type 2 diabetes mellitus without complications: Secondary | ICD-10-CM | POA: Insufficient documentation

## 2024-03-25 DIAGNOSIS — Z7984 Long term (current) use of oral hypoglycemic drugs: Secondary | ICD-10-CM | POA: Insufficient documentation

## 2024-03-25 DIAGNOSIS — Z79899 Other long term (current) drug therapy: Secondary | ICD-10-CM | POA: Diagnosis not present

## 2024-03-25 DIAGNOSIS — M25512 Pain in left shoulder: Secondary | ICD-10-CM | POA: Diagnosis not present

## 2024-03-25 MED ORDER — HYDROCODONE-ACETAMINOPHEN 5-325 MG PO TABS
1.0000 | ORAL_TABLET | Freq: Once | ORAL | Status: AC
  Administered 2024-03-25: 1 via ORAL
  Filled 2024-03-25: qty 1

## 2024-03-25 MED ORDER — HYDROCODONE-ACETAMINOPHEN 5-325 MG PO TABS: 1.0000 | ORAL_TABLET | ORAL | 0 refills | Status: AC | PRN

## 2024-03-25 NOTE — ED Provider Notes (Signed)
 [place] EMERGENCY DEPARTMENT AT [facility] Provider Note   CSN: [id number] Arrival date & time: 03/25/24  1504     History  Chief Complaint  Patient presents with   Shoulder Pain    Nicole Alvarado is a 48 y.o. female.  Patient complains of pain in her left shoulder.  Patient reports that she fell 3 days ago and hit her shoulder.  Patient reports that she hears crunching when she moves her shoulder.  Patient reports she has had a previous injury to the same shoulder.  She was told that she had a rotator cuff tear and that she needed surgery however she moved and did not have the surgery.  Patient states she now lives here.  She has a past medical history of hypertension elevated cholesterol and diabetes.  Patient denies any other area of injury.   Shoulder Pain      Home Medications Prior to Admission medications   Medication Sig Start Date End Date Taking? Authorizing Provider  Accu-Chek Softclix Lancets lancets Use as instructed 03/01/23   Gardenia Phlegm, MD  albuterol (VENTOLIN HFA) 108 (90 Base) MCG/ACT inhaler Inhale 2 puffs into the lungs every 6 (six) hours as needed for wheezing or shortness of breath. 08/26/23   Billie Lade, MD  atorvastatin (LIPITOR) 20 MG tablet Take 1 tablet (20 mg total) by mouth daily. 04/23/23   Billie Lade, MD  blood glucose meter kit and supplies KIT Dispense based on patient and insurance preference. Use up to four times daily as directed. 03/14/24   Billie Lade, MD  Blood Glucose Monitoring Suppl (ACCU-CHEK GUIDE ME) w/Device KIT 1 Device by Does not apply route 4 (four) times daily. 03/14/24   Billie Lade, MD  cariprazine (VRAYLAR) 3 MG capsule Take 1 capsule (3 mg total) by mouth daily. 03/14/24   Billie Lade, MD  Cholecalciferol 1.25 MG (50000 UT) capsule Take 1 capsule (50,000 Units total) by mouth once a week. 03/01/23   Gardenia Phlegm, MD  Continuous Glucose Receiver (FREESTYLE LIBRE 3 READER) DEVI 1 Device  by Does not apply route continuous. 03/14/24   Billie Lade, MD  Continuous Glucose Sensor (FREESTYLE LIBRE 3 SENSOR) MISC 1 Device by Does not apply route every 14 (fourteen) days. Place 1 sensor on the skin every 14 days. Use to check glucose continuously 03/14/24   Billie Lade, MD  DULoxetine (CYMBALTA) 30 MG capsule Take 1 capsule (30 mg total) by mouth daily. 03/22/24   Billie Lade, MD  DULoxetine (CYMBALTA) 60 MG capsule Take 1 capsule (60 mg total) by mouth daily. 03/22/24   Billie Lade, MD  gabapentin (NEURONTIN) 100 MG capsule TAKE 1 CAPSULE BY MOUTH THREE TIMES DAILY 11/29/23   Billie Lade, MD  glucose blood (ACCU-CHEK GUIDE) test strip Use as instructed 03/01/23   Gardenia Phlegm, MD  metFORMIN (GLUCOPHAGE-XR) 500 MG 24 hr tablet Take 1 tablet (500 mg total) by mouth daily with breakfast. 01/31/24   Billie Lade, MD  olmesartan (BENICAR) 20 MG tablet Take 1 tablet (20 mg total) by mouth daily. 01/17/24   Billie Lade, MD  pantoprazole (PROTONIX) 40 MG tablet Take 1 tablet (40 mg total) by mouth 2 (two) times daily. 11/08/23   Billie Lade, MD  tirzepatide [facility]) 2.5 MG/0.5ML Pen Inject 2.5 mg into the skin once a week. 03/14/24   Billie Lade, MD      Allergies  Penicillins, Bactrim [sulfamethoxazole-trimethoprim], Sulfa antibiotics, and Keflex [cephalexin]    Review of Systems   Review of Systems  All other systems reviewed and are negative.   Physical Exam Updated Vital Signs BP (!) 162/88 (BP Location: Right Arm)   Pulse 90   Temp 97.9 F (36.6 C)   Resp 17   SpO2 99%  Physical Exam Vitals and nursing note reviewed.  Constitutional:      Appearance: She is well-developed.  HENT:     Head: Normocephalic.  Cardiovascular:     Rate and Rhythm: Normal rate.  Pulmonary:     Effort: Pulmonary effort is normal.  Musculoskeletal:     Comments: Pain with movement of left shoulder.  Good pulses full range of motion wrist and elbow.   Skin:    General: Skin is warm.  Neurological:     General: No focal deficit present.     Mental Status: She is alert and oriented to person, place, and time.     ED Results / Procedures / Treatments   Labs (all labs ordered are listed, but only abnormal results are displayed) Labs Reviewed - No data to display  EKG None  Radiology DG Shoulder Left Result Date: 03/25/2024 CLINICAL DATA:  Left shoulder pain after fall. EXAM: LEFT SHOULDER - 2+ VIEW COMPARISON:  None Available. FINDINGS: There is no evidence of fracture or dislocation. There is no evidence of arthropathy or other focal bone abnormality. Soft tissues are unremarkable. IMPRESSION: Negative. Electronically Signed   By: Lupita Raider M.D.   On: 03/25/2024 16:26    Procedures Procedures    Medications Ordered in ED Medications  HYDROcodone-acetaminophen (NORCO/VICODIN) 5-325 MG per tablet 1 tablet (has no administration in time range)    ED Course/ Medical Decision Making/ A&P                                 Medical Decision Making Patient complains of pain in her left shoulder.  Patient reports that she fell 3 days ago.  Amount and/or Complexity of Data Reviewed Independent Historian: parent External Data Reviewed: labs. Radiology: ordered and independent interpretation performed. Decision-making details documented in ED Course.    Details: Left shoulder  no fracture.   Risk Prescription drug management. Risk Details: Pt placed in a sling.  Pt given rx for hydrocodone.  Pt advised to follow up with Orthopaedist for evaluation            Final Clinical Impression(s) / ED Diagnoses Final diagnoses:  Acute pain of left shoulder    Rx / DC Orders ED Discharge Orders          Ordered    HYDROcodone-acetaminophen (NORCO/VICODIN) 5-325 MG tablet  Every 4 hours PRN        03/25/24 1652           An After Visit Summary was printed and given to the patient.    Elson Areas,  PA-C 03/25/24 1653    Melene Plan, DO 03/25/24 [phone]

## 2024-03-25 NOTE — Discharge Instructions (Signed)
Follow up with Orthopaedist for evaluation

## 2024-03-25 NOTE — Progress Notes (Signed)
 Orthopedic Tech Progress Note Patient Details:  Nicole Alvarado 04/11/1976 [id number]  Ortho Devices Type of Ortho Device: Arm sling Ortho Device/Splint Location: For LUE Ortho Device/Splint Interventions: Ordered   Post Interventions Patient Tolerated: Well Instructions Provided: Care of device  [name] [name] 03/25/2024, 4:56 PM

## 2024-03-25 NOTE — ED Triage Notes (Signed)
 Pt c.o left shoulder pain x3 days s/p fall. Pt has hx of rotator cuff tear a year ago in which she never had surgery for.

## 2024-03-28 ENCOUNTER — Ambulatory Visit (HOSPITAL_BASED_OUTPATIENT_CLINIC_OR_DEPARTMENT_OTHER)

## 2024-03-28 ENCOUNTER — Encounter (HOSPITAL_BASED_OUTPATIENT_CLINIC_OR_DEPARTMENT_OTHER): Payer: Self-pay | Admitting: Student

## 2024-03-28 ENCOUNTER — Ambulatory Visit (INDEPENDENT_AMBULATORY_CARE_PROVIDER_SITE_OTHER): Admitting: Student

## 2024-03-28 DIAGNOSIS — M542 Cervicalgia: Secondary | ICD-10-CM | POA: Diagnosis not present

## 2024-03-28 DIAGNOSIS — M47812 Spondylosis without myelopathy or radiculopathy, cervical region: Secondary | ICD-10-CM | POA: Diagnosis not present

## 2024-03-28 DIAGNOSIS — M5412 Radiculopathy, cervical region: Secondary | ICD-10-CM

## 2024-03-28 DIAGNOSIS — M25512 Pain in left shoulder: Secondary | ICD-10-CM | POA: Diagnosis not present

## 2024-03-28 MED ORDER — MELOXICAM 15 MG PO TABS: 15.0000 mg | ORAL_TABLET | Freq: Every day | ORAL | 0 refills | Status: AC

## 2024-03-28 MED ORDER — METHOCARBAMOL 500 MG PO TABS: 500.0000 mg | ORAL_TABLET | Freq: Four times a day (QID) | ORAL | 0 refills | Status: AC

## 2024-03-28 NOTE — Progress Notes (Signed)
 Chief Complaint: Left shoulder pain    Discussed the use of AI scribe software for clinical note transcription with the patient, who gave verbal consent to proceed.  History of Present Illness Nicole Alvarado is a 48 year old female who presents with worsening left shoulder pain. She has been experiencing left shoulder pain for approximately one year, initially triggered by a fall in a tub while in [place]. An MRI at that time revealed a slight rotator cuff tear. Surgery was not pursued due to her relocation.  Six days ago, she experienced another fall while getting into bed, which significantly worsened her shoulder pain. The pain is described as severe, radiating from the shoulder blade down the arm, and is persistent, making it difficult to find a comfortable position in any posture.  She does have occasional muscle spasms.  She experiences tingling and numbness in her fingers, specifically the pointer, middle, and thumb, and occasionally in her elbow. For pain management, she has been using Norco (hydrocodone) and ibuprofen without significant relief. A cortisone shot previously provided temporary relief for a few days. She also mentions using a sling, which was ineffective due to its poor fit.  Her past medical history includes high blood pressure, diabetes, and vitamin D deficiency, for which she is on supplementation. No neck pain but reports a burning sensation when moving the neck.  Surgical History:   None  PMH/PSH/Family History/Social History/Meds/Allergies:    Past Medical History:  Diagnosis Date   Anxiety    Diabetes mellitus without complication (HCC)    Hypertension    Neuropathy    Past Surgical History:  Procedure Laterality Date   CESAREAN SECTION     5   TOTAL ABDOMINAL HYSTERECTOMY     Social History   Socioeconomic History   Marital status: Married    Spouse name: Not on file   Number of children: Not on file   Years  of education: Not on file   Highest education level: 11th grade  Occupational History   Not on file  Tobacco Use   Smoking status: Never    Passive exposure: Never   Smokeless tobacco: Never  Vaping Use   Vaping status: Never Used  Substance and Sexual Activity   Alcohol use: Never   Drug use: Never   Sexual activity: Not on file  Other Topics Concern   Not on file  Social History Narrative   Not on file   Social Drivers of Health   Financial Resource Strain: Medium Risk (03/14/2024)   Overall Financial Resource Strain (CARDIA)    Difficulty of Paying Living Expenses: Somewhat hard  Food Insecurity: Food Insecurity Present (03/14/2024)   Hunger Vital Sign    Worried About Running Out of Food in the Last Year: Sometimes true    Ran Out of Food in the Last Year: Never true  Transportation Needs: Unmet Transportation Needs (03/14/2024)   PRAPARE - Transportation    Lack of Transportation (Medical): Yes    Lack of Transportation (Non-Medical): Yes  Physical Activity: Inactive (03/14/2024)   Exercise Vital Sign    Days of Exercise per Week: 0 days    Minutes of Exercise per Session: 20 min  Stress: Stress Concern Present (03/14/2024)   [employer] of Occupational Health - Occupational Stress Questionnaire    Feeling  of Stress : To some extent  Social Connections: Socially Isolated (03/14/2024)   Social Connection and Isolation Panel [NHANES]    Frequency of Communication with Friends and Family: Twice a week    Frequency of Social Gatherings with Friends and Family: Never    Attends Religious Services: Never    Diplomatic Services operational officer: No    Attends Engineer, structural: Not on file    Marital Status: Married   Family History  Problem Relation Age of Onset   Heart attack Mother 75   Anxiety disorder Mother    Anxiety disorder Sister    Multiple sclerosis Sister    Allergies  Allergen Reactions   Penicillins Anaphylaxis   Bactrim  [Sulfamethoxazole-Trimethoprim] Hives   Sulfa Antibiotics Hives   Keflex [Cephalexin] Rash   Current Outpatient Medications  Medication Sig Dispense Refill   meloxicam (MOBIC) 15 MG tablet Take 1 tablet (15 mg total) by mouth daily for 14 days. 14 tablet 0   methocarbamol (ROBAXIN) 500 MG tablet Take 1 tablet (500 mg total) by mouth 4 (four) times daily for 10 days. 40 tablet 0   Accu-Chek Softclix Lancets lancets Use as instructed 100 each 12   albuterol (VENTOLIN HFA) 108 (90 Base) MCG/ACT inhaler Inhale 2 puffs into the lungs every 6 (six) hours as needed for wheezing or shortness of breath. 8 g 0   atorvastatin (LIPITOR) 20 MG tablet Take 1 tablet (20 mg total) by mouth daily. 90 tablet 3   blood glucose meter kit and supplies KIT Dispense based on patient and insurance preference. Use up to four times daily as directed. 1 each 0   Blood Glucose Monitoring Suppl (ACCU-CHEK GUIDE ME) w/Device KIT 1 Device by Does not apply route 4 (four) times daily. 1 kit 0   cariprazine (VRAYLAR) 3 MG capsule Take 1 capsule (3 mg total) by mouth daily. 30 capsule 2   Cholecalciferol 1.25 MG (50000 UT) capsule Take 1 capsule (50,000 Units total) by mouth once a week. 8 capsule 0   Continuous Glucose Receiver (FREESTYLE LIBRE 3 READER) DEVI 1 Device by Does not apply route continuous. 1 each 0   Continuous Glucose Sensor (FREESTYLE LIBRE 3 SENSOR) MISC 1 Device by Does not apply route every 14 (fourteen) days. Place 1 sensor on the skin every 14 days. Use to check glucose continuously 2 each 3   DULoxetine (CYMBALTA) 30 MG capsule Take 1 capsule (30 mg total) by mouth daily. 30 capsule 0   DULoxetine (CYMBALTA) 60 MG capsule Take 1 capsule (60 mg total) by mouth daily. 30 capsule 0   gabapentin (NEURONTIN) 100 MG capsule TAKE 1 CAPSULE BY MOUTH THREE TIMES DAILY 270 capsule 0   glucose blood (ACCU-CHEK GUIDE) test strip Use as instructed 100 each 12   HYDROcodone-acetaminophen (NORCO/VICODIN) 5-325 MG tablet  Take 1 tablet by mouth every 4 (four) hours as needed. 12 tablet 0   metFORMIN (GLUCOPHAGE-XR) 500 MG 24 hr tablet Take 1 tablet (500 mg total) by mouth daily with breakfast. 90 tablet 3   olmesartan (BENICAR) 20 MG tablet Take 1 tablet (20 mg total) by mouth daily. 90 tablet 0   pantoprazole (PROTONIX) 40 MG tablet Take 1 tablet (40 mg total) by mouth 2 (two) times daily. 60 tablet 1   tirzepatide (MOUNJARO) 2.5 MG/0.5ML Pen Inject 2.5 mg into the skin once a week. 2 mL 0   No current facility-administered medications for this visit.   No results found.  Review of Systems:   A ROS was performed including pertinent positives and negatives as documented in the HPI.  Physical Exam :   Constitutional: NAD and appears stated age Neurological: Alert and oriented Psych: Appropriate affect and cooperative There were no vitals taken for this visit.   Comprehensive Musculoskeletal Exam:    Tenderness in the left cervical paraspinal muscles and discomfort elicited with cervical range of motion in all planes.  Positive Spurling's maneuver.  Active left shoulder range of motion to 80 degrees forward flexion, 30 degrees external rotation, and limited internal rotation.  Grip strength 4/5 in left hand compared to 5/5 on contralateral side.  Positive Tinel's test at the left wrist.  Imaging:   Xray (cervical spine 4 views): No evidence of acute abnormality.  Multilevel degenerative disc disease at C5-C6 and C6-C7.   I personally reviewed and interpreted the radiographs.      Assessment & Plan Left shoulder pain with suspected rotator cuff tear   Chronic pain worsened by a fall, with previous history of MRI indicating a slight rotator cuff tear. Severe pain persists despite current medications. Differential diagnosis includes rotator cuff tear and cervical radiculopathy. Prescribe Robaxin for muscle spasms and switch to meloxicam for pain and inflammation. Refer to physical therapy for neck and  shoulder rehabilitation. Consider MRI if pain and/or weakness persist post-therapy.  Cervical radiculopathy   Symptoms align with cervical radiculopathy given pain radiating to the shoulder blade and down the right upper extremity with positive Spurling's. These symptoms may also contribute to shoulder pain. Refer to physical therapy for neck exercises and rehabilitation. Advise using a traction pillow to alleviate nerve compression. Monitor symptoms and consider MRI if no improvement with physical therapy.  Finger numbness Intermittent finger numbness suggests possible carpal tunnel syndrome, potentially related to cervical radiculopathy or independent. Monitor symptoms and reassess if tingling persists after addressing neck and shoulder issues.  Diabetes mellitus   Diabetes with a recent A1c of 11. High blood sugar levels contraindicate steroid use. Continue current medication regimen and monitoring.  Follow-up   Schedule a follow-up appointment in 4-5 weeks to reassess shoulder and neck condition. Coordinate with physical therapy for ongoing treatment.  Patient may also obtain prior MRI imaging of her left shoulder for review.     I personally saw and evaluated the patient, and participated in the management and treatment plan.  Hazle Nordmann, PA-C Orthopedics

## 2024-03-31 ENCOUNTER — Other Ambulatory Visit (HOSPITAL_BASED_OUTPATIENT_CLINIC_OR_DEPARTMENT_OTHER): Payer: Self-pay | Admitting: Student

## 2024-03-31 ENCOUNTER — Encounter (HOSPITAL_BASED_OUTPATIENT_CLINIC_OR_DEPARTMENT_OTHER): Payer: Self-pay

## 2024-03-31 MED ORDER — TRAMADOL HCL 50 MG PO TABS: 50.0000 mg | ORAL_TABLET | Freq: Four times a day (QID) | ORAL | 0 refills | Status: AC | PRN

## 2024-04-05 ENCOUNTER — Other Ambulatory Visit (HOSPITAL_BASED_OUTPATIENT_CLINIC_OR_DEPARTMENT_OTHER): Payer: Self-pay | Admitting: Orthopaedic Surgery

## 2024-04-05 ENCOUNTER — Other Ambulatory Visit: Payer: Self-pay

## 2024-04-05 ENCOUNTER — Ambulatory Visit: Attending: Student

## 2024-04-05 ENCOUNTER — Other Ambulatory Visit: Payer: Self-pay | Admitting: Internal Medicine

## 2024-04-05 ENCOUNTER — Encounter (HOSPITAL_BASED_OUTPATIENT_CLINIC_OR_DEPARTMENT_OTHER): Payer: Self-pay

## 2024-04-05 DIAGNOSIS — M5412 Radiculopathy, cervical region: Secondary | ICD-10-CM | POA: Insufficient documentation

## 2024-04-05 DIAGNOSIS — E1140 Type 2 diabetes mellitus with diabetic neuropathy, unspecified: Secondary | ICD-10-CM

## 2024-04-05 DIAGNOSIS — M25511 Pain in right shoulder: Secondary | ICD-10-CM | POA: Diagnosis not present

## 2024-04-05 MED ORDER — TRAMADOL HCL 50 MG PO TABS: 50.0000 mg | ORAL_TABLET | Freq: Four times a day (QID) | ORAL | 0 refills | Status: AC | PRN

## 2024-04-05 NOTE — Therapy (Signed)
 OUTPATIENT PHYSICAL THERAPY CERVICAL EVALUATION   Patient Name: Nicole Alvarado MRN: [id number] DOB:08/05/1976, 48 y.o., female Today's Date: 04/05/2024  END OF SESSION:   PT End of Session - 04/05/24 1230     Visit Number 1    Number of Visits 12    Date for PT Re-Evaluation 05/17/24    Authorization Type [facility]    Authorization - Visit Number 1    PT Start Time 1220    PT Stop Time 1252    PT Time Calculation (min) 32 min    Activity Tolerance Patient tolerated treatment well    Behavior During Therapy WFL for tasks assessed/performed              Past Medical History:  Diagnosis Date   Anxiety    Diabetes mellitus without complication (HCC)    Hypertension    Neuropathy    Past Surgical History:  Procedure Laterality Date   CESAREAN SECTION     5   TOTAL ABDOMINAL HYSTERECTOMY     Patient Active Problem List   Diagnosis Date Noted   Candida vaginitis 12/03/2023   Hyperlipidemia associated with type 2 diabetes mellitus (HCC) 04/23/2023   Vitamin D deficiency 03/23/2023   Colon cancer screening 03/23/2023   HTN (hypertension) 03/11/2023   Morbid obesity (HCC) 02/24/2023   Recurrent major depressive disorder, in partial remission (HCC) 02/23/2023   Type 2 diabetes mellitus with diabetic neuropathy, without long-term current use of insulin (HCC) 02/23/2023   Insomnia 02/23/2023   Anxiety 09/30/2022   Neuropathy 09/30/2022    PCP: Billie Lade, MD  REFERRING PROVIDER: Hazle Nordmann, PA-C   REFERRING DIAG: Neck pain [M54.2]   THERAPY DIAG:  Radiculopathy, cervical region  Right shoulder pain, unspecified chronicity  Rationale for Evaluation and Treatment: Rehabilitation  ONSET DATE: 03/28/2024 date of referral   SUBJECTIVE:                                                                                                                                                                                                         SUBJECTIVE  STATEMENT:  Patient reports to PT today d/t neck pain and right shoulder that has been present for 2 weeks after falling off the bed onto her side. Reports that it was due to low blood sugar. She states most bothersome with sitting up, standing up and sleeping.      PERTINENT HISTORY:  Relevant PMHx includes anxiety, DM, HTN, neuropathy, obesity, depression   PAIN:  Are you having pain?  Yes: NPRS scale: 7/10 current (  UE), 3/10 current (neck), 10/10 pain  Aggravating factors: sitting up, standing up and sleeping. Relieving factors: tramadol, muscle relaxer  PRECAUTIONS: MOI was Fall d/t self-reported hypoglycemia   RED FLAGS: None     WEIGHT BEARING RESTRICTIONS: No  FALLS:  Has patient fallen in last 6 months? Yes. Number of falls 1  LIVING ENVIRONMENT: Lives with: lives with their family and lives with their spouse Lives in: House/apartment Stairs:  Yes "I don't use them"  Has following equipment at home: None  OCCUPATION: N/A - household cleaning  PLOF: Independent  PATIENT GOALS: To not have pain. Return to PLOF including household cleaning.   NEXT MD VISIT: "schedule a follow up appointment in 4-5 weeks to reassess shoulder and neck condition"   OBJECTIVE:  Note: Objective measures were completed at Evaluation unless otherwise noted.  DIAGNOSTIC FINDINGS:  Per referring provider:   Xray (cervical spine 4 views): No evidence of acute abnormality.  Multilevel degenerative disc disease at C5-C6 and C6-C7.  PATIENT SURVEYS:  NDI 27/50 = 54% perceived disability   "I can hardly drive my car because of severe neck pain"   "I have a lot of difficulty concentrating when I want"   "My sleep is slightly disturbed (3-4 hr sleep loss)"  "I can hardly do any usual work at all"   "I can read as much as I want with moderate neck pain."   "Pain prevents me from lifting but I can manage if they are conveniently placed."  "I can look after myself normally, but it causes  extra pain."   COGNITION: Overall cognitive status: Within functional limits for tasks assessed  POSTURE: rounded shoulders and forward head  PALPATION: Moderate-to-severe tenderness to palpation along L>R suboccipital, UT, levator musculature      CERVICAL ROM:   Active ROM A/PROM (deg) eval  Flexion 25  Extension 10  Right lateral flexion 10  Left lateral flexion 20  Right rotation 50  Left rotation 20   (Blank rows = not tested)  UPPER EXTREMITY ROM:  Active ROM Right eval Left eval  Shoulder flexion    Shoulder extension    Shoulder abduction    Shoulder adduction    Shoulder extension    Shoulder internal rotation    Shoulder external rotation    Elbow flexion    Elbow extension    Wrist flexion    Wrist extension    Wrist ulnar deviation    Wrist radial deviation    Wrist pronation    Wrist supination     (Blank rows = not tested)  UPPER EXTREMITY MMT:  MMT Right eval Left eval  Shoulder flexion    Shoulder extension    Shoulder abduction    Shoulder adduction    Shoulder extension    Shoulder internal rotation    Shoulder external rotation    Middle trapezius    Lower trapezius    Elbow flexion    Elbow extension    Wrist flexion    Wrist extension    Wrist ulnar deviation    Wrist radial deviation    Wrist pronation    Wrist supination    Grip strength     (Blank rows = not tested)  CERVICAL SPECIAL TESTS:  Upper limb tension test (ULTT): Positive with median nerve bias  Distraction test: positive initially; however patient reports peripheralization of symptoms with manual traction x 5 (5-10 sec hold)    TREATMENT DATE:   [facility] Adult PT Treatment:  DATE: 04/05/2024   Initial evaluation: see patient education and home exercise program as noted below                                                                                                                                    PATIENT EDUCATION:  Education details: reviewed initial home exercise program; discussion of POC, prognosis and goals for skilled PT   Person educated: Patient Education method: Explanation, Demonstration, and Handouts Education comprehension: verbalized understanding, returned demonstration, and needs further education  HOME EXERCISE PROGRAM: Access Code: [email or web address] URL: [email or web address] Date: 04/05/2024 Prepared by: Mauri Reading  Exercises - Median Nerve Flossing - Tray  - 2 x daily - 7 x weekly - 2 sets - 10 reps - 3 sec hold - Seated Cervical Retraction  - 2 x daily - 7 x weekly - 2 sets - 10 reps - 3 sec hold  ASSESSMENT:  CLINICAL IMPRESSION: Jodee is a 48 y.o. female who was seen today for physical therapy evaluation and treatment for Neck Pain with radicular symptoms to Upper Extremity. She is demonstrating positive ULLT with median nerve bias, peripheralization of UE symptoms with cervical distraction, decreased CS AROM with extension, left rotation, and right>left lateral flexion. She has related pain and difficulty with sitting, reading, standing, heavy lifting and driving. She also has related difficulty concentrating and sleeping. These limitations are impacting her ADLs, IADLs and occupational activities. She requires skilled PT services at this time to address relevant deficits and improve overall function.     OBJECTIVE IMPAIRMENTS: decreased activity tolerance, decreased mobility, decreased ROM, decreased strength, impaired UE functional use, postural dysfunction, and pain.   ACTIVITY LIMITATIONS: carrying, lifting, sitting, standing, sleeping, dressing, reach over head, and hygiene/grooming  PARTICIPATION LIMITATIONS: meal prep, cleaning, laundry, driving, shopping, community activity, and occupation  PERSONAL FACTORS: Past/current experiences and 3+ comorbidities: Relevant PMHx includes anxiety, DM, HTN, neuropathy, obesity, depression   are  also affecting patient's functional outcome.   REHAB POTENTIAL: Fair    CLINICAL DECISION MAKING: Evolving/moderate complexity  EVALUATION COMPLEXITY: Moderate   GOALS: Goals reviewed with patient? Yes  SHORT TERM GOALS: Target date: 04/26/2024   Patient will be independent with initial home program at least 3 days/week  Baseline: provided at eval  Goal status: INITIAL  2.  Patient will demonstrate improved postural awareness for at least 15 minutes while seated without need for cueing from PT.   Baseline: see objective measures  Goal status: INITIAL   LONG TERM GOALS: Target date: 05/17/2024    Patient will report improved overall functional ability with NDI score of 17/50 or less Baseline: 27/50 Goal status: INITIAL  2.  Patient will report ability to perform her ADLs, including bathing and dressing, without exacerbation of symptoms.  Baseline: pain increases with personal care tasks Goal status: INITIAL  3.  Patient will demonstrate improved CS AROM by at least 10-15 degrees with extension, lateral flexion (BIL),  and left rotation Baseline: see objective measures Goal status: INITIAL  4.  Patient will report ability to perform regular household cleaning tasks in 30 minute bouts with no more than 5/10 UQ pain severity.  Baseline: unable to tolerate >5-10 minutes, 7-10/10 pain at worst  Goal status: INITIAL    PLAN:  PT FREQUENCY: 1-2x/week  PT DURATION: 6 weeks  PLANNED INTERVENTIONS: 97164- PT Re-evaluation, 97110-Therapeutic exercises, 97530- Therapeutic activity, 97112- Neuromuscular re-education, 97535- Self Care, [id number]- Manual therapy, [id number]- Electrical stimulation (unattended), [phone]- Traction (mechanical), Patient/Family education, Taping, Dry Needling, Joint mobilization, Joint manipulation, Spinal manipulation, Spinal mobilization, Cryotherapy, and Moist heat  [employer] Authorization   Choose one: Rehabilitative  Standardized Assessment or Functional  Outcome Tool: See Pain Assessment and NDI  Score or Percent Disability: 27/50 = 54% disability   Body Parts Treated (Select each separately):  Cervicothoracic. Overall deficits/functional limitations for body part selected: moderate Shoulder. Overall deficits/functional limitations for body part selected: moderate    PLAN FOR NEXT SESSION: CS mobilization, cervical mm stretches, review median nerve glide, UQ strengthening, manual therapy and modalities as indicated, UBE as appropriate    Mauri Reading, PT, DPT  04/07/2024 9:47 PM

## 2024-04-06 ENCOUNTER — Other Ambulatory Visit (HOSPITAL_COMMUNITY): Payer: Self-pay

## 2024-04-06 ENCOUNTER — Encounter: Payer: Self-pay | Admitting: Internal Medicine

## 2024-04-06 DIAGNOSIS — E1140 Type 2 diabetes mellitus with diabetic neuropathy, unspecified: Secondary | ICD-10-CM

## 2024-04-06 MED ORDER — TIRZEPATIDE 5 MG/0.5ML ~~LOC~~ SOAJ
5.0000 mg | SUBCUTANEOUS | 0 refills | Status: DC
Start: 2024-04-06 — End: 2024-04-13

## 2024-04-06 MED ORDER — BLOOD GLUCOSE MONITOR KIT: PACK | 0 refills | Status: AC

## 2024-04-06 MED ORDER — ACCU-CHEK GUIDE ME W/DEVICE KIT: 1.0000 | PACK | Freq: Four times a day (QID) | 0 refills | Status: AC

## 2024-04-08 DIAGNOSIS — Z419 Encounter for procedure for purposes other than remedying health state, unspecified: Secondary | ICD-10-CM | POA: Diagnosis not present

## 2024-04-10 ENCOUNTER — Encounter: Payer: Self-pay | Admitting: Internal Medicine

## 2024-04-11 ENCOUNTER — Encounter: Payer: Self-pay | Admitting: Physical Therapy

## 2024-04-11 ENCOUNTER — Ambulatory Visit: Admitting: Physical Therapy

## 2024-04-11 DIAGNOSIS — M5412 Radiculopathy, cervical region: Secondary | ICD-10-CM

## 2024-04-11 DIAGNOSIS — M25511 Pain in right shoulder: Secondary | ICD-10-CM

## 2024-04-11 NOTE — Therapy (Addendum)
 OUTPATIENT PHYSICAL THERAPY TREATMENT + NO VISIT DISCHARGE SUMMARY (see below)    Patient Name: Nicole Alvarado MRN: [id number] DOB:03-Oct-1976, 48 y.o., female Today's Date: 04/11/2024  END OF SESSION:   PT End of Session - 04/11/24 1621     Visit Number 2    Number of Visits 12    Date for PT Re-Evaluation 05/17/24    Authorization Type [facility]    Authorization Time Period 10 visits 04/05/24-06/04/24    Authorization - Visit Number 2    Authorization - Number of Visits 10    PT Start Time 1622   late check in   PT Stop Time 1700    PT Time Calculation (min) 38 min    Activity Tolerance Patient tolerated treatment well               Past Medical History:  Diagnosis Date   Anxiety    Diabetes mellitus without complication (HCC)    Hypertension    Neuropathy    Past Surgical History:  Procedure Laterality Date   CESAREAN SECTION     5   TOTAL ABDOMINAL HYSTERECTOMY     Patient Active Problem List   Diagnosis Date Noted   Candida vaginitis 12/03/2023   Hyperlipidemia associated with type 2 diabetes mellitus (HCC) 04/23/2023   Vitamin D  deficiency 03/23/2023   Colon cancer screening 03/23/2023   HTN (hypertension) 03/11/2023   Morbid obesity (HCC) 02/24/2023   Recurrent major depressive disorder, in partial remission (HCC) 02/23/2023   Type 2 diabetes mellitus with diabetic neuropathy, without long-term current use of insulin (HCC) 02/23/2023   Insomnia 02/23/2023   Anxiety 09/30/2022   Neuropathy 09/30/2022    PCP: Melvenia Manus BRAVO, MD  REFERRING PROVIDER: Leonce Reveal, PA-C   REFERRING DIAG: Neck pain [M54.2]   THERAPY DIAG:  Radiculopathy, cervical region  Right shoulder pain, unspecified chronicity  Rationale for Evaluation and Treatment: Rehabilitation  ONSET DATE: 03/28/2024 date of referral   SUBJECTIVE:                                                                                                                                                                                                         Per eval - Patient reports to PT today d/t neck pain and right shoulder that has been present for 2 weeks after falling off the bed onto her side. Reports that it was due to low blood sugar. She states most bothersome with sitting up, standing up and sleeping.    SUBJECTIVE STATEMENT:  04/11/2024 Pt states symptoms seem to  be worsening. Having a lot of trouble sleeping, fluctuating. Has difficulty laying on L shoulder. Pt states she has done HEP, sometimes it makes things better, sometimes worse. Neck does seem to be better.    PERTINENT HISTORY:  Relevant PMHx includes anxiety, DM, HTN, neuropathy, obesity, depression   PAIN:  Are you having pain? 6-7/10 in shoulder and LUE   Per eval -  Yes: NPRS scale: 7/10 current (UE), 3/10 current (neck), 10/10 pain  Aggravating factors: sitting up, standing up and sleeping. Relieving factors: tramadol , muscle relaxer  PRECAUTIONS: MOI was Fall d/t self-reported hypoglycemia   RED FLAGS: None     WEIGHT BEARING RESTRICTIONS: No  FALLS:  Has patient fallen in last 6 months? Yes. Number of falls 1  LIVING ENVIRONMENT: Lives with: lives with their family and lives with their spouse Lives in: House/apartment Stairs: Yes I don't use them  Has following equipment at home: None  OCCUPATION: N/A - household cleaning  PLOF: Independent  PATIENT GOALS: To not have pain. Return to PLOF including household cleaning.   NEXT MD VISIT: schedule a follow up appointment in 4-5 weeks to reassess shoulder and neck condition   OBJECTIVE:  Note: Objective measures were completed at Evaluation unless otherwise noted.  DIAGNOSTIC FINDINGS:  Per referring provider:   Xray (cervical spine 4 views): No evidence of acute abnormality.  Multilevel degenerative disc disease at C5-C6 and C6-C7.  PATIENT SURVEYS:  NDI 27/50 = 54% perceived disability   I can hardly drive my car  because of severe neck pain   I have a lot of difficulty concentrating when I want   My sleep is slightly disturbed (3-4 hr sleep loss)  I can hardly do any usual work at all   I can read as much as I want with moderate neck pain.   Pain prevents me from lifting but I can manage if they are conveniently placed.  I can look after myself normally, but it causes extra pain.   COGNITION: Overall cognitive status: Within functional limits for tasks assessed  POSTURE: rounded shoulders and forward head  PALPATION: Moderate-to-severe tenderness to palpation along L>R suboccipital, UT, levator musculature      CERVICAL ROM:   Active ROM A/PROM (deg) eval  Flexion 25  Extension 10  Right lateral flexion 10  Left lateral flexion 20  Right rotation 50  Left rotation 20   (Blank rows = not tested)  UPPER EXTREMITY ROM:  Active ROM Right eval Left eval  Shoulder flexion    Shoulder extension    Shoulder abduction    Shoulder adduction    Shoulder extension    Shoulder internal rotation    Shoulder external rotation    Elbow flexion    Elbow extension    Wrist flexion    Wrist extension    Wrist ulnar deviation    Wrist radial deviation    Wrist pronation    Wrist supination     (Blank rows = not tested)  UPPER EXTREMITY MMT:  MMT Right eval Left eval  Shoulder flexion    Shoulder extension    Shoulder abduction    Shoulder adduction    Shoulder extension    Shoulder internal rotation    Shoulder external rotation    Middle trapezius    Lower trapezius    Elbow flexion    Elbow extension    Wrist flexion    Wrist extension    Wrist ulnar deviation    Wrist radial  deviation    Wrist pronation    Wrist supination    Grip strength     (Blank rows = not tested)  CERVICAL SPECIAL TESTS:  Upper limb tension test (ULTT): Positive with median nerve bias  Distraction test: positive initially; however patient reports peripheralization of symptoms  with manual traction x 5 (5-10 sec hold)    TREATMENT DATE:  [facility] Adult PT Treatment:                                                DATE: 04/11/24 Therapeutic Exercise:  Swiss ball GH flexion AAROM 2x8 Swiss ball GH scaption AAROM 2x8 Gentle L LS stretch 2x30sec  Gentle L UT stretch 3x30sec HEP education/discussion  Neuromuscular re-ed: Cervical retraction 2x10 significant time/cues for reduced compensations Scapular retraction 2x10 cues for reduced UT compensation Median nerve glide 2x10 cues for comfortable ROM/pacing     [facility] Adult PT Treatment:                                                DATE: 04/05/2024 Initial evaluation: see patient education and home exercise program as noted below                                                                                                                                   PATIENT EDUCATION:  Education details: rationale for interventions, HEP  Person educated: Patient Education method: Explanation, Demonstration, Tactile cues, Verbal cues Education comprehension: verbalized understanding, returned demonstration, verbal cues required, tactile cues required, and needs further education     HOME EXERCISE PROGRAM: Access Code: [email or web address] URL: [email or web address] Date: 04/05/2024 Prepared by: Marko Molt  Exercises - Median Nerve Flossing - Tray  - 2 x daily - 7 x weekly - 2 sets - 10 reps - 3 sec hold - Seated Cervical Retraction  - 2 x daily - 7 x weekly - 2 sets - 10 reps - 3 sec hold  ASSESSMENT:  CLINICAL IMPRESSION: 04/11/2024 Pt arrives w/ report of increased symptoms, variable response to HEP. Requires cues to mitigate compensations with HEP and reports good relief when performed appropriately, education on appropriate setup at home. Also able to expand program to include periscapular stability and gentle GH/cervical mobility, pt requires cues for appropriate performance but again reports modest relief with  performance. No adverse events, reports 5/10 pain on departure compared to 6-7/10 on arrival. Recommend continuing along current POC in order to address relevant deficits and improve functional tolerance. Pt departs today's session in no acute distress, all voiced questions/concerns addressed appropriately from PT perspective.    Per eval - Lesly is a 48 y.o. female who was  seen today for physical therapy evaluation and treatment for Neck Pain with radicular symptoms to Upper Extremity. She is demonstrating positive ULLT with median nerve bias, peripheralization of UE symptoms with cervical distraction, decreased CS AROM with extension, left rotation, and right>left lateral flexion. She has related pain and difficulty with sitting, reading, standing, heavy lifting and driving. She also has related difficulty concentrating and sleeping. These limitations are impacting her ADLs, IADLs and occupational activities. She requires skilled PT services at this time to address relevant deficits and improve overall function.     OBJECTIVE IMPAIRMENTS: decreased activity tolerance, decreased mobility, decreased ROM, decreased strength, impaired UE functional use, postural dysfunction, and pain.   ACTIVITY LIMITATIONS: carrying, lifting, sitting, standing, sleeping, dressing, reach over head, and hygiene/grooming  PARTICIPATION LIMITATIONS: meal prep, cleaning, laundry, driving, shopping, community activity, and occupation  PERSONAL FACTORS: Past/current experiences and 3+ comorbidities: Relevant PMHx includes anxiety, DM, HTN, neuropathy, obesity, depression  are also affecting patient's functional outcome.   REHAB POTENTIAL: Fair    CLINICAL DECISION MAKING: Evolving/moderate complexity  EVALUATION COMPLEXITY: Moderate   GOALS: Goals reviewed with patient? Yes  SHORT TERM GOALS: Target date: 04/26/2024   Patient will be independent with initial home program at least 3 days/week  Baseline: provided at  eval  Goal status: INITIAL  2.  Patient will demonstrate improved postural awareness for at least 15 minutes while seated without need for cueing from PT.   Baseline: see objective measures  Goal status: INITIAL   LONG TERM GOALS: Target date: 05/17/2024    Patient will report improved overall functional ability with NDI score of 17/50 or less Baseline: 27/50 Goal status: INITIAL  2.  Patient will report ability to perform her ADLs, including bathing and dressing, without exacerbation of symptoms.  Baseline: pain increases with personal care tasks Goal status: INITIAL  3.  Patient will demonstrate improved CS AROM by at least 10-15 degrees with extension, lateral flexion (BIL), and left rotation Baseline: see objective measures Goal status: INITIAL  4.  Patient will report ability to perform regular household cleaning tasks in 30 minute bouts with no more than 5/10 UQ pain severity.  Baseline: unable to tolerate >5-10 minutes, 7-10/10 pain at worst  Goal status: INITIAL    PLAN:  PT FREQUENCY: 1-2x/week  PT DURATION: 6 weeks  PLANNED INTERVENTIONS: 97164- PT Re-evaluation, 97110-Therapeutic exercises, 97530- Therapeutic activity, 97112- Neuromuscular re-education, 97535- Self Care, 02859- Manual therapy, G0283- Electrical stimulation (unattended), [phone]- Traction (mechanical), Patient/Family education, Taping, Dry Needling, Joint mobilization, Joint manipulation, Spinal manipulation, Spinal mobilization, Cryotherapy, and Moist heat  [employer] Authorization   Choose one: Rehabilitative  Standardized Assessment or Functional Outcome Tool: See Pain Assessment and NDI  Score or Percent Disability: 27/50 = 54% disability   Body Parts Treated (Select each separately):  Cervicothoracic. Overall deficits/functional limitations for body part selected: moderate Shoulder. Overall deficits/functional limitations for body part selected: moderate    PLAN FOR NEXT SESSION: CS  mobilization, cervical mm stretches, review median nerve glide, UQ strengthening, manual therapy and modalities as indicated, UBE as appropriate    Alm DELENA Jenny PT, DPT 04/11/2024 5:05 PM     Discharge addendum 06/19/2024  PHYSICAL THERAPY DISCHARGE SUMMARY  Visits from Start of Care: 2  Current functional level related to goals / functional outcomes: Unable to be assessed   Remaining deficits: Unable to be assessed   Education / Equipment: Unable to be assessed  Patient goals were unable to be assessed. Patient is being discharged due to  not returning since the last visit.  Alm DELENA Jenny PT, DPT 06/19/2024 11:07 AM

## 2024-04-13 ENCOUNTER — Other Ambulatory Visit: Payer: Self-pay | Admitting: Internal Medicine

## 2024-04-13 ENCOUNTER — Ambulatory Visit: Admitting: Physical Therapy

## 2024-04-13 DIAGNOSIS — F3341 Major depressive disorder, recurrent, in partial remission: Secondary | ICD-10-CM

## 2024-04-13 DIAGNOSIS — E1140 Type 2 diabetes mellitus with diabetic neuropathy, unspecified: Secondary | ICD-10-CM

## 2024-04-14 MED ORDER — TIRZEPATIDE 5 MG/0.5ML ~~LOC~~ SOAJ: 5.0000 mg | SUBCUTANEOUS | 0 refills | Status: DC

## 2024-04-14 MED ORDER — DULOXETINE HCL 60 MG PO CPEP: 60.0000 mg | ORAL_CAPSULE | Freq: Every day | ORAL | 0 refills | Status: DC

## 2024-04-14 MED ORDER — DULOXETINE HCL 30 MG PO CPEP: 30.0000 mg | ORAL_CAPSULE | Freq: Every day | ORAL | 0 refills | Status: DC

## 2024-04-17 ENCOUNTER — Ambulatory Visit: Admitting: Physical Therapy

## 2024-04-18 ENCOUNTER — Encounter: Payer: Self-pay | Admitting: Internal Medicine

## 2024-04-19 ENCOUNTER — Ambulatory Visit: Admitting: Physical Therapy

## 2024-04-24 ENCOUNTER — Ambulatory Visit: Admitting: Internal Medicine

## 2024-04-25 ENCOUNTER — Ambulatory Visit

## 2024-04-25 ENCOUNTER — Telehealth: Payer: Self-pay

## 2024-04-25 NOTE — Telephone Encounter (Signed)
 LVM regarding missed appt. Reminder of attendance policy and next schedule PT session. - MJ

## 2024-04-26 ENCOUNTER — Ambulatory Visit (INDEPENDENT_AMBULATORY_CARE_PROVIDER_SITE_OTHER)

## 2024-04-26 VITALS — BP 120/82 | HR 91 | Ht 62.0 in | Wt 329.0 lb

## 2024-04-26 DIAGNOSIS — F5104 Psychophysiologic insomnia: Secondary | ICD-10-CM

## 2024-04-26 DIAGNOSIS — F3341 Major depressive disorder, recurrent, in partial remission: Secondary | ICD-10-CM

## 2024-04-26 MED ORDER — QUETIAPINE FUMARATE 25 MG PO TABS: 25.0000 mg | ORAL_TABLET | Freq: Every day | ORAL | 2 refills | Status: DC

## 2024-04-26 NOTE — Progress Notes (Signed)
 Established Patient Office Visit  Subjective   Patient ID: Nicole Alvarado, female    DOB: Apr 30, 1976  Age: 48 y.o. MRN: [id number]  Chief Complaint  Patient presents with   Medical Management of Chronic Issues    Pt Sates she's here for "depression and sore throat"    HPI Patient was started on Vraylar  at office visit in December.  She is not taking at this time.  Past Medical History:  Diagnosis Date   Anxiety    Diabetes mellitus without complication (HCC)    Hypertension    Neuropathy    Past Surgical History:  Procedure Laterality Date   CESAREAN SECTION     5   TOTAL ABDOMINAL HYSTERECTOMY        ROS    Objective:     BP 120/82   Pulse 91   Ht 5\' 2"  (1.575 m)   Wt (!) 329 lb (149.2 kg)   SpO2 93%   BMI 60.17 kg/m  BP Readings from Last 3 Encounters:  04/26/24 120/82  03/25/24 133/80  03/14/24 129/83   Wt Readings from Last 3 Encounters:  04/26/24 (!) 329 lb (149.2 kg)  03/14/24 (!) 333 lb 12.8 oz (151.4 kg)  12/03/23 (!) 328 lb 12.8 oz (149.1 kg)      Physical Exam Vitals and nursing note reviewed.  Constitutional:      Appearance: Normal appearance. She is obese.  HENT:     Head: Normocephalic.     Right Ear: Tympanic membrane, ear canal and external ear normal.     Left Ear: Tympanic membrane, ear canal and external ear normal.     Nose: Nose normal.     Mouth/Throat:     Mouth: Mucous membranes are moist.     Pharynx: Oropharynx is clear.  Cardiovascular:     Rate and Rhythm: Normal rate and regular rhythm.  Pulmonary:     Effort: Pulmonary effort is normal.     Breath sounds: Normal breath sounds.  Musculoskeletal:     Cervical back: Normal range of motion and neck supple.  Skin:    General: Skin is warm and dry.  Neurological:     Mental Status: She is alert and oriented to person, place, and time.  Psychiatric:        Attention and Perception: Attention normal.        Mood and Affect: Affect normal.        Speech:  Speech normal.        Thought Content: Thought content normal. Thought content does not include suicidal ideation. Thought content does not include suicidal plan.      No results found for any visits on 04/26/24.    The 10-year ASCVD risk score (Arnett DK, et al., 2019) is: 1.4%    Assessment & Plan:   Problem List Items Addressed This Visit       Other   Recurrent major depressive disorder, in partial remission (HCC)   Flowsheet Row Office Visit from 04/26/2024 in [facility]  PHQ-9 Total Score 19        She was unable to tolerate the Vylar.  She feels like the Cymbalta  helps for her, but she feels like depression could be better.  She denies any suicidal ideation.  It seems that a lot of her depression is related to her insomnia and sleeping until the afternoon to compensate for being up all night.  We will add Seroquel  at bedtime in hopes  that we can get her back on a normal sleep schedule.  Recommend behavioral health consult if depression or insomnia not improving.      Relevant Medications   QUEtiapine  (SEROQUEL ) 25 MG tablet   Insomnia - Primary   Patient reports chronic insomnia.  Has been on melatonin for years.  She reports that she stays up all night but then will fall asleep in the morning and sleep until 2 in the afternoon.  Will add Seroquel  25 mg at bedtime for insomnia and for her depression.  Also discussed sleep hygiene changes to implement.  Recommend f/u in 2-3 months or sooner if needed.        Relevant Medications   QUEtiapine  (SEROQUEL ) 25 MG tablet    Return in about 6 weeks (around 06/07/2024).    Alison Irvine, FNP

## 2024-04-27 ENCOUNTER — Ambulatory Visit: Attending: Student

## 2024-04-27 ENCOUNTER — Telehealth: Payer: Self-pay

## 2024-04-27 ENCOUNTER — Other Ambulatory Visit: Payer: Self-pay | Admitting: Internal Medicine

## 2024-04-27 ENCOUNTER — Other Ambulatory Visit: Payer: Self-pay

## 2024-04-27 DIAGNOSIS — E1169 Type 2 diabetes mellitus with other specified complication: Secondary | ICD-10-CM

## 2024-04-27 MED ORDER — ATORVASTATIN CALCIUM 20 MG PO TABS: 20.0000 mg | ORAL_TABLET | Freq: Every day | ORAL | 3 refills | Status: AC

## 2024-04-27 NOTE — Telephone Encounter (Signed)
 Refills sent

## 2024-04-27 NOTE — Assessment & Plan Note (Addendum)
 Flowsheet Row Office Visit from 04/26/2024 in [facility]  PHQ-9 Total Score 19        She was unable to tolerate the Vylar.  She feels like the Cymbalta  helps for her, but she feels like depression could be better.  She denies any suicidal ideation.  It seems that a lot of her depression is related to her insomnia and sleeping until the afternoon to compensate for being up all night.  We will add Seroquel  at bedtime in hopes that we can get her back on a normal sleep schedule.  Recommend behavioral health consult if depression or insomnia not improving.

## 2024-04-27 NOTE — Telephone Encounter (Signed)
 Copied from CRM [id number]. Topic: Clinical - Medication Refill >> Apr 27, 2024  3:58 PM Lotus Round B wrote: Most Recent Primary Care Visit:  Provider: Alison Irvine  Department: [facility]  Visit Type: OFFICE VISIT  Date: 04/26/2024  Medication: atorvastatin  (LIPITOR) 20 MG tablet   Has the patient contacted their pharmacy? Yes (Agent: If no, request that the patient contact the pharmacy for the refill. If patient does not wish to contact the pharmacy document the reason why and proceed with request.) (Agent: If yes, when and what did the pharmacy advise?)  Is this the correct pharmacy for this prescription? Yes If no, delete pharmacy and type the correct one.  This is the patient's preferred pharmacy:    [id number] - [place] ([place] - [address] [place] ([place]) [place] [address] Phone: [phone] Fax: [phone]   Has the prescription been filled recently? No  Is the patient out of the medication? Yes  Has the patient been seen for an appointment in the last year OR does the patient have an upcoming appointment? Yes  Can we respond through MyChart? Yes  Agent: Please be advised that Rx refills may take up to 3 business days. We ask that you follow-up with your pharmacy.

## 2024-04-27 NOTE — Telephone Encounter (Signed)
 LVM regarding missed appt. Reminder of attendance policy and next schedule PT session. - MJ

## 2024-04-27 NOTE — Assessment & Plan Note (Addendum)
 Patient reports chronic insomnia.  Has been on melatonin for years.  She reports that she stays up all night but then will fall asleep in the morning and sleep until 2 in the afternoon.  Will add Seroquel  25 mg at bedtime for insomnia and for her depression.  Also discussed sleep hygiene changes to implement.  Recommend f/u in 2-3 months or sooner if needed.

## 2024-04-28 NOTE — Telephone Encounter (Signed)
 Digned 04/27/24

## 2024-05-02 ENCOUNTER — Ambulatory Visit

## 2024-05-04 ENCOUNTER — Ambulatory Visit

## 2024-05-04 ENCOUNTER — Telehealth: Payer: Self-pay

## 2024-05-04 NOTE — Telephone Encounter (Signed)
 PA request has been Received. New Encounter has been or will be created for follow up. For additional info see Pharmacy Prior Auth telephone encounter from 03/14/2024.

## 2024-05-04 NOTE — Telephone Encounter (Signed)
 Needs PA for freestyle libre system

## 2024-05-08 DIAGNOSIS — Z419 Encounter for procedure for purposes other than remedying health state, unspecified: Secondary | ICD-10-CM | POA: Diagnosis not present

## 2024-05-18 ENCOUNTER — Other Ambulatory Visit: Payer: Self-pay | Admitting: Internal Medicine

## 2024-05-18 DIAGNOSIS — I10 Essential (primary) hypertension: Secondary | ICD-10-CM

## 2024-05-22 ENCOUNTER — Telehealth: Admitting: Physician Assistant

## 2024-05-22 DIAGNOSIS — R3989 Other symptoms and signs involving the genitourinary system: Secondary | ICD-10-CM | POA: Diagnosis not present

## 2024-05-22 MED ORDER — NITROFURANTOIN MONOHYD MACRO 100 MG PO CAPS: 100.0000 mg | ORAL_CAPSULE | Freq: Two times a day (BID) | ORAL | 0 refills | Status: DC

## 2024-05-22 NOTE — Progress Notes (Signed)

## 2024-05-29 ENCOUNTER — Other Ambulatory Visit: Payer: Self-pay | Admitting: Internal Medicine

## 2024-05-29 DIAGNOSIS — F3341 Major depressive disorder, recurrent, in partial remission: Secondary | ICD-10-CM

## 2024-06-07 ENCOUNTER — Ambulatory Visit

## 2024-06-08 DIAGNOSIS — Z419 Encounter for procedure for purposes other than remedying health state, unspecified: Secondary | ICD-10-CM | POA: Diagnosis not present

## 2024-06-14 ENCOUNTER — Other Ambulatory Visit (HOSPITAL_COMMUNITY): Payer: Self-pay

## 2024-06-14 ENCOUNTER — Other Ambulatory Visit: Payer: Self-pay

## 2024-06-14 ENCOUNTER — Ambulatory Visit: Admitting: Internal Medicine

## 2024-06-14 ENCOUNTER — Telehealth: Payer: Self-pay | Admitting: Pharmacy Technician

## 2024-06-14 DIAGNOSIS — E1140 Type 2 diabetes mellitus with diabetic neuropathy, unspecified: Secondary | ICD-10-CM

## 2024-06-14 MED ORDER — TIRZEPATIDE 7.5 MG/0.5ML ~~LOC~~ SOAJ: 7.5000 mg | SUBCUTANEOUS | 0 refills | Status: DC

## 2024-06-14 NOTE — Telephone Encounter (Signed)
 Pharmacy Patient Advocate Encounter   Received notification from CoverMyMeds that prior authorization for Mounjaro  7.5MG /0.5ML auto-injectors is required/requested.   Insurance verification completed.   The patient is insured through [facility] [place] .   Per test claim: REFILL TOO SOON. LAST FILLED 04/14/2024 FOR 6MLS/84DAYS. PT MUST USE 75% OF PREVIOUS FILL.

## 2024-06-27 ENCOUNTER — Telehealth: Admitting: Physician Assistant

## 2024-06-27 DIAGNOSIS — K047 Periapical abscess without sinus: Secondary | ICD-10-CM | POA: Diagnosis not present

## 2024-06-27 MED ORDER — CLINDAMYCIN HCL 300 MG PO CAPS: 300.0000 mg | ORAL_CAPSULE | Freq: Three times a day (TID) | ORAL | 0 refills | Status: AC

## 2024-06-27 MED ORDER — NAPROXEN 500 MG PO TABS: 500.0000 mg | ORAL_TABLET | Freq: Two times a day (BID) | ORAL | 0 refills | Status: AC

## 2024-06-27 NOTE — Progress Notes (Signed)
 I have spent 5 minutes in review of e-visit questionnaire, review and updating patient chart, medical decision making and response to patient.   Laure Kidney, PA-C

## 2024-06-27 NOTE — Progress Notes (Signed)

## 2024-06-28 NOTE — Telephone Encounter (Signed)
 Pt needs refills on her 30mg  and 60mg  cymbalta  and prefers it to be sent to [facility] in [facility]

## 2024-06-29 ENCOUNTER — Other Ambulatory Visit: Payer: Self-pay

## 2024-06-29 ENCOUNTER — Ambulatory Visit: Payer: Self-pay

## 2024-06-29 DIAGNOSIS — F3341 Major depressive disorder, recurrent, in partial remission: Secondary | ICD-10-CM

## 2024-06-29 MED ORDER — DULOXETINE HCL 30 MG PO CPEP: 30.0000 mg | ORAL_CAPSULE | Freq: Every day | ORAL | 0 refills | Status: DC

## 2024-06-29 MED ORDER — DULOXETINE HCL 60 MG PO CPEP: 60.0000 mg | ORAL_CAPSULE | Freq: Every day | ORAL | 0 refills | Status: DC

## 2024-06-29 NOTE — Telephone Encounter (Signed)
 FYI Only or Action Required?: Action required by provider: medication refill request.  Patient was last seen in primary care on 04/26/2024 by Bevely Doffing, FNP. Called Nurse Triage reporting Panic Attack. Symptoms began several days ago. Interventions attempted: Nothing. Symptoms are: gradually worsening.  Triage Disposition: See PCP Within 2 Weeks  Patient/caregiver understands and will follow disposition?: yes  Copied from CRM [id number]. Topic: Clinical - Red Word Triage >> Jun 29, 2024  3:29 PM Elle L wrote: Red Word that prompted transfer to Nurse Triage: The patient is having crying spells and worsening depression from being without her Cymbalta  30 MG and 60 MG. She requested it on MyChart on 7/2. I reached out to the office who advised her request has been sent to NP Doffing Bevely. Reason for Disposition  [1] Symptoms of anxiety or panic attack AND [2] is a chronic symptom (recurrent or ongoing AND present > 4 weeks)  Answer Assessment - Initial Assessment Questions 1. CONCERN: Did anything happen that prompted you to call today?      Med is out x2 days 2. ANXIETY SYMPTOMS: Can you describe how you (your loved one; patient) have been feeling? (e.g., tense, restless, panicky, anxious, keyed up, overwhelmed, sense of impending doom).      Crying attacks, panic attacks 3. ONSET: How long have you been feeling this way? (e.g., hours, days, weeks)     2 days  4. SEVERITY: How would you rate the level of anxiety? (e.g., 0 - 10; or mild, moderate, severe).     8 5. FUNCTIONAL IMPAIRMENT: How have these feelings affected your ability to do daily activities? Have you had more difficulty than usual doing your normal daily activities? (e.g., getting better, same, worse; self-care, school, work, interactions)     denies 6. HISTORY: Have you felt this way before? Have you ever been diagnosed with an anxiety problem in the past? (e.g., generalized anxiety disorder, panic attacks,  PTSD). If Yes, ask: How was this problem treated? (e.g., medicines, counseling, etc.)     Panic attack, has ran out of meds before 7. RISK OF HARM - SUICIDAL IDEATION: Do you ever have thoughts of hurting or killing yourself? If Yes, ask:  Do you have these feelings now? Do you have a plan on how you would do this?     Denies HI/SI 8. TREATMENT:  What has been done so far to treat this anxiety? (e.g., medicines, relaxation strategies). What has helped?     Doesn't help 9. TREATMENT - THERAPIST: Do you have a counselor or therapist? Name?  Pt states she just needs her medication refilled.  Protocols used: Anxiety and Panic Attack-A-AH

## 2024-06-29 NOTE — Telephone Encounter (Signed)
 Patient meds were sent to pharmacy

## 2024-07-07 ENCOUNTER — Other Ambulatory Visit (HOSPITAL_COMMUNITY): Payer: Self-pay

## 2024-07-08 DIAGNOSIS — Z419 Encounter for procedure for purposes other than remedying health state, unspecified: Secondary | ICD-10-CM | POA: Diagnosis not present

## 2024-07-14 ENCOUNTER — Ambulatory Visit

## 2024-07-28 ENCOUNTER — Other Ambulatory Visit: Payer: Self-pay

## 2024-07-28 DIAGNOSIS — F3341 Major depressive disorder, recurrent, in partial remission: Secondary | ICD-10-CM

## 2024-07-28 MED ORDER — DULOXETINE HCL 30 MG PO CPEP: 30.0000 mg | ORAL_CAPSULE | Freq: Every day | ORAL | 0 refills | Status: DC

## 2024-07-28 MED ORDER — DULOXETINE HCL 60 MG PO CPEP: 60.0000 mg | ORAL_CAPSULE | Freq: Every day | ORAL | 0 refills | Status: DC

## 2024-08-08 DIAGNOSIS — Z419 Encounter for procedure for purposes other than remedying health state, unspecified: Secondary | ICD-10-CM | POA: Diagnosis not present

## 2024-08-14 ENCOUNTER — Ambulatory Visit

## 2024-08-21 ENCOUNTER — Other Ambulatory Visit: Payer: Self-pay | Admitting: Internal Medicine

## 2024-08-21 DIAGNOSIS — I10 Essential (primary) hypertension: Secondary | ICD-10-CM

## 2024-08-24 ENCOUNTER — Telehealth

## 2024-08-24 ENCOUNTER — Telehealth: Payer: Self-pay

## 2024-08-24 DIAGNOSIS — R3989 Other symptoms and signs involving the genitourinary system: Secondary | ICD-10-CM

## 2024-08-24 DIAGNOSIS — F5101 Primary insomnia: Secondary | ICD-10-CM

## 2024-08-24 DIAGNOSIS — E1140 Type 2 diabetes mellitus with diabetic neuropathy, unspecified: Secondary | ICD-10-CM

## 2024-08-24 DIAGNOSIS — F3341 Major depressive disorder, recurrent, in partial remission: Secondary | ICD-10-CM | POA: Diagnosis not present

## 2024-08-24 DIAGNOSIS — F5104 Psychophysiologic insomnia: Secondary | ICD-10-CM | POA: Diagnosis not present

## 2024-08-24 DIAGNOSIS — Z7985 Long-term (current) use of injectable non-insulin antidiabetic drugs: Secondary | ICD-10-CM

## 2024-08-24 MED ORDER — DULOXETINE HCL 60 MG PO CPEP: 60.0000 mg | ORAL_CAPSULE | Freq: Every day | ORAL | 2 refills | Status: AC

## 2024-08-24 MED ORDER — QUETIAPINE FUMARATE 25 MG PO TABS: 25.0000 mg | ORAL_TABLET | Freq: Every day | ORAL | 1 refills | Status: AC

## 2024-08-24 MED ORDER — NITROFURANTOIN MONOHYD MACRO 100 MG PO CAPS: 100.0000 mg | ORAL_CAPSULE | Freq: Two times a day (BID) | ORAL | 0 refills | Status: AC

## 2024-08-24 MED ORDER — DULOXETINE HCL 30 MG PO CPEP: 30.0000 mg | ORAL_CAPSULE | Freq: Every day | ORAL | 2 refills | Status: AC

## 2024-08-24 MED ORDER — TRULICITY 1.5 MG/0.5ML ~~LOC~~ SOAJ: 1.5000 mg | SUBCUTANEOUS | 2 refills | Status: DC

## 2024-08-24 MED ORDER — GABAPENTIN 100 MG PO CAPS: 100.0000 mg | ORAL_CAPSULE | Freq: Three times a day (TID) | ORAL | 3 refills | Status: AC

## 2024-08-24 NOTE — Telephone Encounter (Signed)
 Copied from CRM #[id number]. Topic: Clinical - Medication Prior Auth >> Aug 24, 2024 12:14 PM Dawna HERO wrote: Reason for CRM: Patient states pharmacy told her she need authorization for Dulaglutide  (TRULICITY ) 1.5 MG/0.5ML SOAJ , for her insurance . She want to know if this can be done today

## 2024-08-24 NOTE — Progress Notes (Signed)
 Virtual Visit via Video Note  I connected with Nicole Alvarado on 08/24/24 at 10:20 AM EDT by a video enabled telemedicine application and verified that I am speaking with the correct person using two identifiers.  Patient Location: Home Provider Location: Office/Clinic  I discussed the limitations, risks, security, and privacy concerns of performing an evaluation and management service by video and the availability of in person appointments. I also discussed with the patient that there may be a patient responsible charge related to this service. The patient expressed understanding and agreed to proceed.  Subjective: PCP: No primary care provider on file.  Chief Complaint  Patient presents with   Medical Management of Chronic Issues    6 week follow up   HPI  Discussed the use of AI scribe software for clinical note transcription with the patient, who gave verbal consent to proceed.  History of Present Illness Discussed the use of AI scribe software for clinical note transcription with the patient, who gave verbal consent to proceed.  History of Present Illness       Nicole Alvarado is a 48 year old female who presents for f/u on addition of Seroquel  for insomnia.  She reports improvement of insomnia with addition of Seroquel .  She is also experiencing burning sensation during urination. She was unable to tolerate the Mounjaro  due to abdominal pain and diarrhea.  She had similar symptoms with Ozempic .  Abdominal pain - Severe, 'uncontrollable' stomach pain - Discontinued Mounjaro  due to concern for worsening symptoms, though similar issues occurred with lower dose - Previously tolerated Trulicity  without gastrointestinal problems  Dysuria - Burning sensation during urination - No hematuria - Symptom is new and concerning to her  Medication use and allergies - Currently taking Seroquel  for insomnia with good effect - Minimal recent use of gabapentin   ROS: Per  HPI  Current Outpatient Medications:    Accu-Chek Softclix Lancets lancets, Use as instructed, Disp: 100 each, Rfl: 12   albuterol  (VENTOLIN  HFA) 108 (90 Base) MCG/ACT inhaler, Inhale 2 puffs into the lungs every 6 (six) hours as needed for wheezing or shortness of breath., Disp: 8 g, Rfl: 0   atorvastatin  (LIPITOR) 20 MG tablet, Take 1 tablet (20 mg total) by mouth daily., Disp: 90 tablet, Rfl: 3   blood glucose meter kit and supplies KIT, Dispense based on patient and insurance preference. Use up to four times daily as directed., Disp: 1 each, Rfl: 0   Blood Glucose Monitoring Suppl (ACCU-CHEK GUIDE ME) w/Device KIT, 1 Device by Does not apply route 4 (four) times daily., Disp: 1 kit, Rfl: 0   Continuous Glucose Receiver (FREESTYLE LIBRE 3 READER) DEVI, 1 Device by Does not apply route continuous., Disp: 1 each, Rfl: 0   Continuous Glucose Sensor (FREESTYLE LIBRE 3 SENSOR) MISC, 1 Device by Does not apply route every 14 (fourteen) days. Place 1 sensor on the skin every 14 days. Use to check glucose continuously, Disp: 2 each, Rfl: 3   Dulaglutide  (TRULICITY ) 1.5 MG/0.5ML SOAJ, Inject 1.5 mg into the skin once a week., Disp: 6 mL, Rfl: 2   glucose blood (ACCU-CHEK GUIDE) test strip, Use as instructed, Disp: 100 each, Rfl: 12   HYDROcodone -acetaminophen  (NORCO/VICODIN) 5-325 MG tablet, Take 1 tablet by mouth every 4 (four) hours as needed., Disp: 12 tablet, Rfl: 0   metFORMIN  (GLUCOPHAGE -XR) 500 MG 24 hr tablet, Take 1 tablet (500 mg total) by mouth daily with breakfast., Disp: 90 tablet, Rfl: 3   olmesartan  (BENICAR )  20 MG tablet, Take 1 tablet by mouth once daily, Disp: 90 tablet, Rfl: 0   traMADol  (ULTRAM ) 50 MG tablet, Take 1 tablet (50 mg total) by mouth every 6 (six) hours as needed., Disp: 30 tablet, Rfl: 0   DULoxetine  (CYMBALTA ) 30 MG capsule, Take 1 capsule (30 mg total) by mouth daily., Disp: 90 capsule, Rfl: 2   DULoxetine  (CYMBALTA ) 60 MG capsule, Take 1 capsule (60 mg total) by mouth  daily., Disp: 90 capsule, Rfl: 2   gabapentin  (NEURONTIN ) 100 MG capsule, Take 1 capsule (100 mg total) by mouth 3 (three) times daily., Disp: 90 capsule, Rfl: 3   nitrofurantoin , macrocrystal-monohydrate, (MACROBID ) 100 MG capsule, Take 1 capsule (100 mg total) by mouth 2 (two) times daily for 7 days., Disp: 14 capsule, Rfl: 0   QUEtiapine  (SEROQUEL ) 25 MG tablet, Take 1 tablet (25 mg total) by mouth at bedtime., Disp: 90 tablet, Rfl: 1  Observations/Objective: There were no vitals filed for this visit. Physical Exam  Assessment and Plan: Primary insomnia  Suspected UTI -     Nitrofurantoin  Monohyd Macro; Take 1 capsule (100 mg total) by mouth 2 (two) times daily for 7 days.  Dispense: 14 capsule; Refill: 0  Type 2 diabetes mellitus with diabetic neuropathy, without long-term current use of insulin (HCC) -     Trulicity ; Inject 1.5 mg into the skin once a week.  Dispense: 6 mL; Refill: 2 -     Gabapentin ; Take 1 capsule (100 mg total) by mouth 3 (three) times daily.  Dispense: 90 capsule; Refill: 3  Recurrent major depressive disorder, in partial remission (HCC) -     DULoxetine  HCl; Take 1 capsule (30 mg total) by mouth daily.  Dispense: 90 capsule; Refill: 2 -     DULoxetine  HCl; Take 1 capsule (60 mg total) by mouth daily.  Dispense: 90 capsule; Refill: 2 -     QUEtiapine  Fumarate; Take 1 tablet (25 mg total) by mouth at bedtime.  Dispense: 90 tablet; Refill: 1  Psychophysiological insomnia -     QUEtiapine  Fumarate; Take 1 tablet (25 mg total) by mouth at bedtime.  Dispense: 90 tablet; Refill: 1   Assessment and Plan    Type 2 diabetes mellitus Experienced severe gastrointestinal side effects from Mounjaro , necessitating discontinuation. Previously tolerated Trulicity . - Prescribe Trulicity . - Check for prior authorization for Trulicity . - Send prescription to [facility] in [place].  Insomnia Seroquel  effectively managed insomnia at current dose. - Refill Seroquel   prescription.  Dysuria Burning sensation during urination suggests urinary tract infection. Allergic to Bactrim, penicillin, and Keflex, so Macrobid  selected. - Prescribe Macrobid  for urinary tract infection. - Send prescription to [facility] in [place].       Follow Up Instructions: No follow-ups on file.   I discussed the assessment and treatment plan with the patient. The patient was provided an opportunity to ask questions, and all were answered. The patient agreed with the plan and demonstrated an understanding of the instructions.   The patient was advised to call back or seek an in-person evaluation if the symptoms worsen or if the condition fails to improve as anticipated.  The above assessment and management plan was discussed with the patient. The patient verbalized understanding of and has agreed to the management plan.   Leita Longs, FNP

## 2024-08-24 NOTE — Telephone Encounter (Signed)
 Changed to virtual.

## 2024-08-29 ENCOUNTER — Other Ambulatory Visit (HOSPITAL_COMMUNITY): Payer: Self-pay

## 2024-08-29 ENCOUNTER — Telehealth: Payer: Self-pay | Admitting: Pharmacy Technician

## 2024-08-29 NOTE — Telephone Encounter (Signed)
 Pharmacy Patient Advocate Encounter   Received notification from Patient Advice Request messages that prior authorization for Trulicity  1.5mg /0.49ml auto-injectors is required/requested.   Insurance verification completed.   The patient is insured through [facility] MEDICAID .   Per test claim: PA required; PA submitted to above mentioned insurance via Latent Key/confirmation #/EOC [id number] Status is pending

## 2024-08-29 NOTE — Telephone Encounter (Signed)
 PA request has been Approved. New Encounter has been or will be created for follow up. For additional info see Pharmacy Prior Auth telephone encounter from 08/29/2024.

## 2024-08-29 NOTE — Telephone Encounter (Signed)
 PA request has been Started. New Encounter has been or will be created for follow up. For additional info see Pharmacy Prior Auth telephone encounter from 08/29/2024.

## 2024-08-29 NOTE — Telephone Encounter (Signed)
 Pharmacy Patient Advocate Encounter  Received notification from [facility] MEDICAID that Prior Authorization for Trulicity  1.5mg /0.60ml auto-injectors  has been APPROVED from 08/29/2024 to 08/29/2025.   PA #/Case ID/Reference #: [id number]

## 2024-08-30 ENCOUNTER — Other Ambulatory Visit: Payer: Self-pay

## 2024-08-30 DIAGNOSIS — E1140 Type 2 diabetes mellitus with diabetic neuropathy, unspecified: Secondary | ICD-10-CM

## 2024-08-30 MED ORDER — TRULICITY 1.5 MG/0.5ML ~~LOC~~ SOAJ: 1.5000 mg | SUBCUTANEOUS | 5 refills | Status: AC

## 2024-08-30 MED ORDER — LANCET DEVICE MISC: 1.0000 | Freq: Three times a day (TID) | 0 refills | Status: AC

## 2024-08-30 MED ORDER — LANCETS MISC. MISC: 1.0000 | Freq: Every day | 11 refills | Status: AC

## 2024-08-30 MED ORDER — BLOOD GLUCOSE TEST VI STRP: 1.0000 | ORAL_STRIP | Freq: Every day | 11 refills | Status: AC

## 2024-08-30 MED ORDER — BLOOD GLUCOSE MONITORING SUPPL DEVI: 1.0000 | Freq: Three times a day (TID) | 0 refills | Status: AC

## 2024-08-31 ENCOUNTER — Other Ambulatory Visit: Payer: Self-pay

## 2024-08-31 DIAGNOSIS — E1140 Type 2 diabetes mellitus with diabetic neuropathy, unspecified: Secondary | ICD-10-CM

## 2024-08-31 MED ORDER — LANCETS MISC. MISC: 1.0000 | Freq: Three times a day (TID) | 0 refills | Status: AC

## 2024-08-31 MED ORDER — BLOOD GLUCOSE TEST VI STRP: 1.0000 | ORAL_STRIP | Freq: Three times a day (TID) | 0 refills | Status: AC

## 2024-08-31 MED ORDER — LANCET DEVICE MISC: 1.0000 | Freq: Three times a day (TID) | 0 refills | Status: AC

## 2024-08-31 MED ORDER — BLOOD GLUCOSE MONITORING SUPPL DEVI: 1.0000 | Freq: Three times a day (TID) | 0 refills | Status: AC

## 2024-09-08 DIAGNOSIS — Z419 Encounter for procedure for purposes other than remedying health state, unspecified: Secondary | ICD-10-CM | POA: Diagnosis not present

## 2024-09-18 ENCOUNTER — Telehealth: Admitting: Physician Assistant

## 2024-09-18 DIAGNOSIS — R3989 Other symptoms and signs involving the genitourinary system: Secondary | ICD-10-CM | POA: Diagnosis not present

## 2024-09-18 MED ORDER — NITROFURANTOIN MONOHYD MACRO 100 MG PO CAPS: 100.0000 mg | ORAL_CAPSULE | Freq: Two times a day (BID) | ORAL | 0 refills | Status: AC

## 2024-09-18 NOTE — Progress Notes (Signed)
 I have spent 5 minutes in review of e-visit questionnaire, review and updating patient chart, medical decision making and response to patient.   Elsie Velma Lunger, PA-C

## 2024-09-18 NOTE — Progress Notes (Signed)

## 2024-09-27 ENCOUNTER — Other Ambulatory Visit: Payer: Self-pay

## 2024-10-24 ENCOUNTER — Ambulatory Visit

## 2024-10-30 ENCOUNTER — Encounter: Payer: Self-pay | Admitting: Radiology

## 2024-11-17 ENCOUNTER — Other Ambulatory Visit: Payer: Self-pay

## 2024-11-17 DIAGNOSIS — I10 Essential (primary) hypertension: Secondary | ICD-10-CM

## 2024-12-08 DIAGNOSIS — Z419 Encounter for procedure for purposes other than remedying health state, unspecified: Secondary | ICD-10-CM | POA: Diagnosis not present

## 2024-12-25 ENCOUNTER — Telehealth: Admitting: Physician Assistant

## 2024-12-25 ENCOUNTER — Telehealth: Admitting: Family Medicine

## 2024-12-25 DIAGNOSIS — J111 Influenza due to unidentified influenza virus with other respiratory manifestations: Secondary | ICD-10-CM

## 2024-12-25 DIAGNOSIS — R300 Dysuria: Secondary | ICD-10-CM

## 2024-12-25 DIAGNOSIS — N390 Urinary tract infection, site not specified: Secondary | ICD-10-CM | POA: Diagnosis not present

## 2024-12-25 DIAGNOSIS — R3989 Other symptoms and signs involving the genitourinary system: Secondary | ICD-10-CM | POA: Diagnosis not present

## 2024-12-25 DIAGNOSIS — R6889 Other general symptoms and signs: Secondary | ICD-10-CM

## 2024-12-25 MED ORDER — NITROFURANTOIN MONOHYD MACRO 100 MG PO CAPS: 100.0000 mg | ORAL_CAPSULE | Freq: Two times a day (BID) | ORAL | 0 refills | Status: AC

## 2024-12-25 MED ORDER — OSELTAMIVIR PHOSPHATE 75 MG PO CAPS: 75.0000 mg | ORAL_CAPSULE | Freq: Two times a day (BID) | ORAL | 0 refills | Status: AC

## 2024-12-25 NOTE — Progress Notes (Signed)
" ° °  Thank you for the details you included in the comment boxes. Those details are very helpful in determining the best course of treatment for you and help us  to provide the best care.Because you have multiple complaints (E-visits are for singular simple issues only), we recommend that you schedule a Virtual Urgent Care video visit in order for the provider to better assess what is going on.  The provider will be able to give you a more accurate diagnosis and treatment plan if we can more freely discuss your symptoms and with the addition of a virtual examination.   If you change your visit to a video visit, we will bill your insurance (similar to an office visit) and you will not be charged for this e-Visit. You will be able to stay at home and speak with the first available [facility] advanced practice provider. The link to do a video visit is in the drop down Menu tab of your Welcome screen in MyChart.    "

## 2024-12-25 NOTE — Progress Notes (Signed)
 " Virtual Visit Consent   Nicole Alvarado, you are scheduled for a virtual visit with a [facility] provider today. Just as with appointments in the office, your consent must be obtained to participate. Your consent will be active for this visit and any virtual visit you may have with one of our providers in the next 365 days. If you have a MyChart account, a copy of this consent can be sent to you electronically.  As this is a virtual visit, video technology does not allow for your provider to perform a traditional examination. This may limit your provider's ability to fully assess your condition. If your provider identifies any concerns that need to be evaluated in person or the need to arrange testing (such as labs, EKG, etc.), we will make arrangements to do so. Although advances in technology are sophisticated, we cannot ensure that it will always work on either your end or our end. If the connection with a video visit is poor, the visit may have to be switched to a telephone visit. With either a video or telephone visit, we are not always able to ensure that we have a secure connection.  By engaging in this virtual visit, you consent to the provision of healthcare and authorize for your insurance to be billed (if applicable) for the services provided during this visit. Depending on your insurance coverage, you may receive a charge related to this service.  I need to obtain your verbal consent now. Are you willing to proceed with your visit today? Nicole Alvarado has provided verbal consent on 12/25/2024 for a virtual visit (video or telephone). Loa Lamp, FNP  Date: 12/25/2024 5:03 PM   Virtual Visit via Video Note   I, Loa Lamp, connected with  Nicole Alvarado  ([id number], 1976-01-26) on 12/25/2024 at  5:00 PM EST by a video-enabled telemedicine application and verified that I am speaking with the correct person using two identifiers.  Location: Patient: Virtual Visit Location  Patient: Home Provider: Virtual Visit Location Provider: Home Office   I discussed the limitations of evaluation and management by telemedicine and the availability of in person appointments. The patient expressed understanding and agreed to proceed.    History of Present Illness: Nicole Alvarado is a 48 y.o. who identifies as a female who was assigned female at birth, and is being seen today for chills, body aches, fever, Diarrhea for 2 days. Exposed to flu at work. Mild cough. No wheezing or sob.  Also reports burning and frequency on urination, can only take macrobid , no abd pain.   HPI: HPI  Problems:  Patient Active Problem List   Diagnosis Date Noted   Candida vaginitis 12/03/2023   Hyperlipidemia associated with type 2 diabetes mellitus (HCC) 04/23/2023   Vitamin D  deficiency 03/23/2023   Colon cancer screening 03/23/2023   HTN (hypertension) 03/11/2023   Morbid obesity (HCC) 02/24/2023   Recurrent major depressive disorder, in partial remission 02/23/2023   Type 2 diabetes mellitus with diabetic neuropathy, without long-term current use of insulin (HCC) 02/23/2023   Insomnia 02/23/2023   Anxiety 09/30/2022   Neuropathy 09/30/2022    Allergies: Allergies[1] Medications: Current Medications[2]  Observations/Objective: Patient is well-developed, well-nourished in no acute distress.  Resting comfortably  at home.  Head is normocephalic, atraumatic.  No labored breathing.  Speech is clear and coherent with logical content.  Patient is alert and oriented at baseline.    Assessment and Plan: 1. Influenza (Primary)  2. Recurrent UTI  3. Suspected UTI  Increase fluids, humidifier, rest, UC if sx persist or worsen.   Follow Up Instructions: I discussed the assessment and treatment plan with the patient. The patient was provided an opportunity to ask questions and all were answered. The patient agreed with the plan and demonstrated an understanding of the instructions.   A copy of instructions were sent to the patient via MyChart unless otherwise noted below.     The patient was advised to call back or seek an in-person evaluation if the symptoms worsen or if the condition fails to improve as anticipated.    Lucy Boardman, FNP     [1]  Allergies Allergen Reactions   Penicillins Anaphylaxis   Bactrim [Sulfamethoxazole-Trimethoprim] Hives   Sulfa Antibiotics Hives   Keflex [Cephalexin] Rash  [2]  Current Outpatient Medications:    nitrofurantoin , macrocrystal-monohydrate, (MACROBID ) 100 MG capsule, Take 1 capsule (100 mg total) by mouth 2 (two) times daily for 7 days., Disp: 14 capsule, Rfl: 0   oseltamivir  (TAMIFLU ) 75 MG capsule, Take 1 capsule (75 mg total) by mouth 2 (two) times daily for 5 days., Disp: 10 capsule, Rfl: 0   Accu-Chek Softclix Lancets lancets, Use as instructed, Disp: 100 each, Rfl: 12   albuterol  (VENTOLIN  HFA) 108 (90 Base) MCG/ACT inhaler, Inhale 2 puffs into the lungs every 6 (six) hours as needed for wheezing or shortness of breath., Disp: 8 g, Rfl: 0   atorvastatin  (LIPITOR) 20 MG tablet, Take 1 tablet (20 mg total) by mouth daily., Disp: 90 tablet, Rfl: 3   blood glucose meter kit and supplies KIT, Dispense based on patient and insurance preference. Use up to four times daily as directed., Disp: 1 each, Rfl: 0   Blood Glucose Monitoring Suppl (ACCU-CHEK GUIDE ME) w/Device KIT, 1 Device by Does not apply route 4 (four) times daily., Disp: 1 kit, Rfl: 0   Blood Glucose Monitoring Suppl DEVI, 1 each by Does not apply route in the morning, at noon, and at bedtime. May substitute to any manufacturer covered by patient's insurance., Disp: 1 each, Rfl: 0   Blood Glucose Monitoring Suppl DEVI, 1 each by Does not apply route in the morning, at noon, and at bedtime. May substitute to any manufacturer covered by patient's insurance. DX e11.65, Disp: 1 each, Rfl: 0   Continuous Glucose Receiver (FREESTYLE LIBRE 3 READER) DEVI, 1 Device by Does  not apply route continuous., Disp: 1 each, Rfl: 0   Continuous Glucose Sensor (FREESTYLE LIBRE 3 SENSOR) MISC, 1 Device by Does not apply route every 14 (fourteen) days. Place 1 sensor on the skin every 14 days. Use to check glucose continuously, Disp: 2 each, Rfl: 3   Dulaglutide  (TRULICITY ) 1.5 MG/0.5ML SOAJ, Inject 1.5 mg into the skin once a week., Disp: 2 mL, Rfl: 5   DULoxetine  (CYMBALTA ) 30 MG capsule, Take 1 capsule (30 mg total) by mouth daily., Disp: 90 capsule, Rfl: 2   DULoxetine  (CYMBALTA ) 60 MG capsule, Take 1 capsule (60 mg total) by mouth daily., Disp: 90 capsule, Rfl: 2   gabapentin  (NEURONTIN ) 100 MG capsule, Take 1 capsule (100 mg total) by mouth 3 (three) times daily., Disp: 90 capsule, Rfl: 3   Glucose Blood (BLOOD GLUCOSE TEST STRIPS) STRP, 1 each by In Vitro route daily. May substitute to any manufacturer covered by patient's insurance., Disp: 50 strip, Rfl: 11   HYDROcodone -acetaminophen  (NORCO/VICODIN) 5-325 MG tablet, Take 1 tablet by mouth every 4 (four) hours as needed., Disp: 12 tablet, Rfl: 0  metFORMIN  (GLUCOPHAGE -XR) 500 MG 24 hr tablet, Take 1 tablet (500 mg total) by mouth daily with breakfast., Disp: 90 tablet, Rfl: 3   nitrofurantoin , macrocrystal-monohydrate, (MACROBID ) 100 MG capsule, Take 1 capsule (100 mg total) by mouth 2 (two) times daily., Disp: 10 capsule, Rfl: 0   olmesartan  (BENICAR ) 20 MG tablet, Take 1 tablet by mouth once daily, Disp: 90 tablet, Rfl: 0   QUEtiapine  (SEROQUEL ) 25 MG tablet, Take 1 tablet (25 mg total) by mouth at bedtime., Disp: 90 tablet, Rfl: 1   traMADol  (ULTRAM ) 50 MG tablet, Take 1 tablet (50 mg total) by mouth every 6 (six) hours as needed., Disp: 30 tablet, Rfl: 0  "

## 2024-12-25 NOTE — Patient Instructions (Signed)
 Influenza, Adult Influenza is also called the flu. It's an infection that affects your respiratory tract. This includes your nose, throat, windpipe, and lungs. The flu is contagious. This means it spreads easily from person to person. It causes symptoms that are like a cold. It can also cause a high fever and body aches. What are the causes? The flu is caused by the influenza virus. You can get it by: Breathing in droplets that are in the air after an infected person coughs or sneezes. Touching something that has the virus on it and then touching your mouth, nose, or eyes. What increases the risk? You may be more likely to get the flu if: You don't wash your hands often. You're near a lot of people during cold and flu season. You touch your mouth, eyes, or nose without washing your hands first. You don't get a flu shot each year. You may also be more at risk for the flu and serious problems, such as a lung infection called pneumonia, if: You're older than 65. You're pregnant. Your immune system is weak. Your immune system is your body's defense system. You have a long-term, or chronic, condition, such as: Heart, kidney, or lung disease. Diabetes. A liver disorder. Asthma. You're very overweight. You have anemia. This is when you don't have enough red blood cells in your body. What are the signs or symptoms? Flu symptoms often start all of a sudden. They may last 4-14 days and include: Fever and chills. Headaches, body aches, or muscle aches. Sore throat. Cough. Runny or stuffy nose. Discomfort in your chest. Not wanting to eat as much as normal. Feeling weak or tired. Feeling dizzy. Nausea or vomiting. How is this diagnosed? The flu may be diagnosed based on your symptoms and medical history. You may also have a physical exam. A swab may be taken from your nose or throat and tested for the virus. How is this treated? If the flu is found early, you can be treated with antiviral  medicine. This may be given to you by mouth or through an IV. It can help you feel less sick and get better faster. Taking care of yourself at home can also help your symptoms get better. Your health care provider may tell you to: Take over-the-counter medicines. Drink lots of fluids. The flu often goes away on its own. If you have very bad symptoms or problems caused by the flu, you may need to be treated in a hospital. Follow these instructions at home: Activity Rest as needed. Get lots of sleep. Stay home from work or school as told by your provider. Leave home only to go see your provider. Do not leave home for other reasons until you don't have a fever for 24 hours without taking medicine. Eating and drinking Take an oral rehydration solution (ORS). This is a drink that is sold at pharmacies and stores. Drink enough fluid to keep your pee pale yellow. Try to drink small amounts of clear fluids. These include water, ice chips, fruit juice mixed with water, and low-calorie sports drinks. Try to eat bland foods that are easy to digest. These include bananas, applesauce, rice, lean meats, toast, and crackers. Avoid drinks that have a lot of sugar or caffeine in them. These include energy drinks, regular sports drinks, and soda. Do not drink alcohol. Do not eat spicy or fatty foods. General instructions     Take your medicines only as told by your provider. Use a cool mist humidifier  to add moisture to the air in your home. This can make it easier for you to breathe. You should also clean the humidifier every day. To do so: Empty the water. Pour clean water in. Cover your mouth and nose when you cough or sneeze. Wash your hands with soap and water often and for at least 20 seconds. It's extra important to do so after you cough or sneeze. If you can't use soap and water, use hand sanitizer. How is this prevented?  Get a flu shot every year. Ask your provider when you should get your flu  shot. Stay away from people who are sick during fall and winter. Fall and winter are cold and flu season. Contact a health care provider if: You get new symptoms. You have chest pain. You have watery poop, also called diarrhea. You have a fever. Your cough gets worse. You start to have more mucus. You feel like you may vomit, or you vomit. Get help right away if: You become short of breath or have trouble breathing. Your skin or nails turn blue. You have very bad pain or stiffness in your neck. You get a sudden headache or pain in your face or ear. You vomit each time you eat or drink. These symptoms may be an emergency. Call 911 right away. Do not wait to see if the symptoms will go away. Do not drive yourself to the hospital. This information is not intended to replace advice given to you by your health care provider. Make sure you discuss any questions you have with your health care provider. Document Revised: 09/16/2023 Document Reviewed: 01/21/2023 Elsevier Patient Education  2024 [employer]Urinary Tract Infection, Female A urinary tract infection (UTI) is an infection in your urinary tract. The urinary tract is made up of organs that make, store, and get rid of pee (urine) in your body. These organs include: The kidneys. The ureters. The bladder. The urethra. What are the causes? Most UTIs are caused by germs called bacteria. They may be in or near your genitals. These germs grow and cause swelling in your urinary tract. What increases the risk? You're more likely to get a UTI if: You're a female. The urethra is shorter in females than in males. You have a soft tube called a catheter that drains your pee. You can't control when you pee or poop. You have trouble peeing because of: A kidney stone. A urinary blockage. A nerve condition that affects your bladder. Not getting enough to drink. You're sexually active. You use a birth control inside your vagina, like  spermicide. You're pregnant. You have low levels of the hormone estrogen in your body. You're an older adult. You're also more likely to get a UTI if you have other health problems. These may include: Diabetes. A weak immune system. Your immune system is your body's defense system. Sickle cell disease. Injury of the spine. What are the signs or symptoms? Symptoms may include: Needing to pee right away. Peeing small amounts often. Pain or burning when you pee. Blood in your pee. Pee that smells bad or odd. Pain in your belly or lower back. You may also: Feel confused. This may be the first symptom in older adults. Vomit. Not feel hungry. Feel tired or easily annoyed. Have a fever or chills. How is this diagnosed? A UTI is diagnosed based on your medical history and an exam. You may also have other tests. These may include: Pee tests. Blood tests. Tests for sexually transmitted  infections (STIs). If you've had more than one UTI, you may need to have imaging studies done to find out why you keep getting them. How is this treated? A UTI can be treated by: Taking antibiotics or other medicines. Drinking enough fluid to keep your pee pale yellow. In rare cases, a UTI can cause a very bad condition called sepsis. Sepsis may be treated in the hospital. Follow these instructions at home: Medicines Take your medicines only as told by your health care provider. If you were given antibiotics, take them as told by your provider. Do not stop taking them even if you start to feel better. General instructions Make sure you: Pee often and fully. Do not hold your pee for a long time. Wipe from front to back after you pee or poop. Use each tissue only once when you wipe. Pee after you have sex. Do not douche or use sprays or powders in your genital area. Contact a health care provider if: Your symptoms don't get better after 1-2 days of taking antibiotics. Your symptoms go away and then  come back. You have a fever or chills. You vomit or feel like you may vomit. Get help right away if: You have very bad pain in your back or lower belly. You faint. This information is not intended to replace advice given to you by your health care provider. Make sure you discuss any questions you have with your health care provider. Document Revised: 11/24/2023 Document Reviewed: 03/19/2023 Elsevier Patient Education  2025 [employer].

## 2025-01-07 ENCOUNTER — Other Ambulatory Visit: Payer: Self-pay | Admitting: Internal Medicine

## 2025-01-07 DIAGNOSIS — E1140 Type 2 diabetes mellitus with diabetic neuropathy, unspecified: Secondary | ICD-10-CM

## 2025-01-08 ENCOUNTER — Other Ambulatory Visit: Payer: Self-pay

## 2025-01-08 DIAGNOSIS — E1140 Type 2 diabetes mellitus with diabetic neuropathy, unspecified: Secondary | ICD-10-CM

## 2025-01-08 MED ORDER — METFORMIN HCL ER 500 MG PO TB24: 500.0000 mg | ORAL_TABLET | Freq: Every day | ORAL | 0 refills | Status: AC

## 2025-01-08 NOTE — Telephone Encounter (Signed)
 Copied from CRM [id number]. Topic: Clinical - Medication Refill >> Jan 08, 2025 11:10 AM Laymon HERO wrote: Medication: metFORMIN  (GLUCOPHAGE -XR) 500 MG 24 hr tablet  Has the patient contacted their pharmacy? Yes (Agent: If no, request that the patient contact the pharmacy for the refill. If patient does not wish to contact the pharmacy document the reason why and proceed with request.) (Agent: If yes, when and what did the pharmacy advise?)  This is the patient's preferred pharmacy:  [facility] [address], [place] - [address] [place] [address] Phone: [phone] Fax: [phone]  Is this the correct pharmacy for this prescription? Yes If no, delete pharmacy and type the correct one.   Has the prescription been filled recently? Yes  Is the patient out of the medication? Yes  Has the patient been seen for an appointment in the last year OR does the patient have an upcoming appointment? Yes  Can we respond through MyChart? Yes  Agent: Please be advised that Rx refills may take up to 3 business days. We ask that you follow-up with your pharmacy.

## 2025-01-14 ENCOUNTER — Telehealth: Admitting: Physician Assistant

## 2025-01-14 DIAGNOSIS — B379 Candidiasis, unspecified: Secondary | ICD-10-CM

## 2025-01-14 DIAGNOSIS — T3695XA Adverse effect of unspecified systemic antibiotic, initial encounter: Secondary | ICD-10-CM | POA: Diagnosis not present

## 2025-01-15 MED ORDER — FLUCONAZOLE 150 MG PO TABS: 150.0000 mg | ORAL_TABLET | ORAL | 0 refills | Status: AC | PRN

## 2025-01-15 NOTE — Progress Notes (Signed)

## 2025-03-09 ENCOUNTER — Ambulatory Visit: Payer: Self-pay
